# Patient Record
Sex: Male | Born: 1945 | Race: Black or African American | Hispanic: No | Marital: Married | Smoking: Current some day smoker
Health system: Southern US, Community
[De-identification: ages and names within clinical notes are randomized; demographics above are authoritative.]

## PROBLEM LIST (undated history)

## (undated) DIAGNOSIS — S82899A Other fracture of unspecified lower leg, initial encounter for closed fracture: Secondary | ICD-10-CM

## (undated) DIAGNOSIS — I10 Essential (primary) hypertension: Secondary | ICD-10-CM

## (undated) DIAGNOSIS — M109 Gout, unspecified: Secondary | ICD-10-CM

## (undated) DIAGNOSIS — E119 Type 2 diabetes mellitus without complications: Secondary | ICD-10-CM

## (undated) HISTORY — PX: CATARACT EXTRACTION: SUR2

## (undated) HISTORY — DX: Type 2 diabetes mellitus without complications: E11.9

## (undated) HISTORY — DX: Other fracture of unspecified lower leg, initial encounter for closed fracture: S82.899A

---

## 1998-11-01 ENCOUNTER — Inpatient Hospital Stay (HOSPITAL_COMMUNITY): Admission: AD | Admit: 1998-11-01 | Discharge: 1998-11-05 | Payer: Self-pay | Admitting: Cardiology

## 1998-11-04 ENCOUNTER — Encounter: Payer: Self-pay | Admitting: Cardiology

## 1998-11-04 ENCOUNTER — Encounter: Payer: Self-pay | Admitting: *Deleted

## 1998-11-05 ENCOUNTER — Encounter: Payer: Self-pay | Admitting: Gastroenterology

## 1998-11-14 ENCOUNTER — Ambulatory Visit (HOSPITAL_COMMUNITY): Admission: RE | Admit: 1998-11-14 | Discharge: 1998-11-14 | Payer: Self-pay | Admitting: Gastroenterology

## 1998-11-14 ENCOUNTER — Encounter: Payer: Self-pay | Admitting: Gastroenterology

## 2005-08-17 ENCOUNTER — Emergency Department (HOSPITAL_COMMUNITY): Admission: EM | Admit: 2005-08-17 | Discharge: 2005-08-17 | Payer: Self-pay | Admitting: Emergency Medicine

## 2007-09-13 ENCOUNTER — Encounter: Payer: Self-pay | Admitting: Internal Medicine

## 2007-09-13 ENCOUNTER — Ambulatory Visit: Payer: Self-pay | Admitting: Internal Medicine

## 2007-09-13 ENCOUNTER — Ambulatory Visit (HOSPITAL_COMMUNITY): Admission: RE | Admit: 2007-09-13 | Discharge: 2007-09-13 | Payer: Self-pay | Admitting: Internal Medicine

## 2007-09-13 HISTORY — PX: COLONOSCOPY: SHX174

## 2007-12-23 DIAGNOSIS — Z87891 Personal history of nicotine dependence: Secondary | ICD-10-CM

## 2007-12-23 DIAGNOSIS — Z8679 Personal history of other diseases of the circulatory system: Secondary | ICD-10-CM | POA: Insufficient documentation

## 2010-09-16 ENCOUNTER — Encounter: Payer: Self-pay | Admitting: Internal Medicine

## 2010-10-14 NOTE — Op Note (Signed)
[name], [name]                ACCOUNT NO.:  [id number]   MEDICAL RECORD NO.:  [id number]          PATIENT TYPE:  AMB   LOCATION:  DAY                           FACILITY:  [facility]   PHYSICIAN:  R. Roetta Sessions, M.D. DATE OF BIRTH:  1945/09/30   DATE OF PROCEDURE:  09/13/2007  DATE OF DISCHARGE:                               OPERATIVE REPORT   INDICATIONS FOR PROCEDURE:  A 61-year African American gentleman with no  lower GI tract symptoms sent at the request of Dr. Felecia Shelling for colorectal  cancer screening.  He has never had his lower GI tract imaged.  There is  no family history of colorectal ablation.  Colonoscopy is now being  done.  Potential risks, benefits, alternatives, and limitations have  been reviewed, questions answered.  He is agreeable.  Please see  documentation and medical record.   PROCEDURE NOTE:  O2 saturation, blood pressure, pulse, and respirations  were monitored throughout the entire procedure.  Conscious sedation of  Versed 3 mg IV, Demerol 75 mg IV in divided doses.   INSTRUMENT:  Pentax video chip system.   FINDINGS:  Digital rectal exam revealed no abnormalities.  The scope was  placed.  The prep was adequate.  Colon:  Colonic mucosa was surveyed  from the rectosigmoid junction through the left transverse right colon  to the appendiceal orifice, ileocecal valve, and cecum.  These  structures were well seen and photographed for the record.  Terminal  ileum was intubated 5 cm from this level.  Scope was slowly and  cautiously withdrawn.  All previous mentioned mucosal surfaces were  again seen.  At the splenic flexure, there was a 1-cm pedunculated  polyp.  The remainder of the colonic mucosa appeared entirely normal.  This polyp was removed with hot snare cautery and recovered through the  scope.  The scope was then pulled down into the rectum where thorough  examination of rectal mucosa including retroflexion and anteversion  demonstrated no  abnormalities.  The patient tolerated the procedure well  and left the endoscopy area.   IMPRESSION:  1. Normal rectum.  2. Splenic flexure polyp removed as described above.  The colonic      mucosa and terminal ileal mucosa appeared normal.   RECOMMENDATIONS:  1. Follow up on path.  2. Further recommendations to follow.      Jonathon Bellows, M.D.  Electronically Signed     RMR/MEDQ  D:  09/13/2007  T:  09/13/2007  Job:  [id number]   cc:   Ninetta Lights D. Felecia Shelling, MD  Fax: [phone]

## 2010-10-24 ENCOUNTER — Ambulatory Visit: Payer: Self-pay | Admitting: Internal Medicine

## 2011-05-15 ENCOUNTER — Other Ambulatory Visit: Payer: Self-pay | Admitting: *Deleted

## 2014-05-16 ENCOUNTER — Emergency Department (HOSPITAL_COMMUNITY): Payer: Commercial Managed Care - HMO

## 2014-05-16 ENCOUNTER — Encounter (HOSPITAL_COMMUNITY): Payer: Self-pay | Admitting: *Deleted

## 2014-05-16 ENCOUNTER — Emergency Department (HOSPITAL_COMMUNITY)
Admission: EM | Admit: 2014-05-16 | Discharge: 2014-05-16 | Disposition: A | Discharge status: Home / Self Care | Payer: Commercial Managed Care - HMO | Attending: Emergency Medicine | Admitting: Emergency Medicine

## 2014-05-16 DIAGNOSIS — I10 Essential (primary) hypertension: Secondary | ICD-10-CM | POA: Diagnosis not present

## 2014-05-16 DIAGNOSIS — M25562 Pain in left knee: Secondary | ICD-10-CM | POA: Insufficient documentation

## 2014-05-16 DIAGNOSIS — Z79899 Other long term (current) drug therapy: Secondary | ICD-10-CM | POA: Insufficient documentation

## 2014-05-16 DIAGNOSIS — Z720 Tobacco use: Secondary | ICD-10-CM | POA: Insufficient documentation

## 2014-05-16 DIAGNOSIS — R52 Pain, unspecified: Secondary | ICD-10-CM

## 2014-05-16 DIAGNOSIS — M79605 Pain in left leg: Secondary | ICD-10-CM

## 2014-05-16 DIAGNOSIS — M10072 Idiopathic gout, left ankle and foot: Secondary | ICD-10-CM | POA: Diagnosis not present

## 2014-05-16 HISTORY — DX: Essential (primary) hypertension: I10

## 2014-05-16 HISTORY — DX: Gout, unspecified: M10.9

## 2014-05-16 MED ORDER — INDOMETHACIN 25 MG PO CAPS: 25.0000 mg | ORAL_CAPSULE | Freq: Three times a day (TID) | ORAL | Status: DC | PRN

## 2014-05-16 MED ORDER — TRAMADOL HCL 50 MG PO TABS: 50.0000 mg | ORAL_TABLET | Freq: Four times a day (QID) | ORAL | Status: DC | PRN

## 2014-05-16 NOTE — ED Notes (Signed)
Pt states gout pain behind left knee began Friday and was worse Monday.

## 2014-05-16 NOTE — Discharge Instructions (Signed)
Follow up with your md in 1 week °

## 2014-05-16 NOTE — ED Provider Notes (Signed)
CSN: [id number]     Arrival date & time 05/16/14  0903 History  This chart was scribe for Todd Diego, MD by Judithann Sauger, ED Scribe. The patient was seen in room [facility] and the patient's care was started at 9:21 AM.     Chief Complaint  Patient presents with  . Gout   The history is provided by the patient. No language interpreter was used.   HPI Comments: Todd [name] is a 68 y.o. male who presents to the Emergency Department complaining of a gradually worsening gout behind his left knee onset 5 days ago. He reports associated knee pain. He explains that the gout is normally in his left big toe and has never been in this location before. He adds that he has not had a flare up in two years.   Past Medical History  Diagnosis Date  . Gout   . Hypertension    Past Surgical History  Procedure Laterality Date  . Colonoscopy  09/13/2007    normal rectum,splenic flexure polyp removed 1 cm   . Cataract extraction     No family history on file. History  Substance Use Topics  . Smoking status: Current Some Day Smoker  . Smokeless tobacco: Not on file  . Alcohol Use: Yes     Comment: daily. Couple of shots.     Review of Systems  Constitutional: Negative for appetite change and fatigue.  HENT: Negative for congestion, ear discharge and sinus pressure.   Eyes: Negative for discharge.  Respiratory: Negative for cough.   Cardiovascular: Negative for chest pain.  Gastrointestinal: Negative for abdominal pain and diarrhea.  Genitourinary: Negative for frequency and hematuria.  Musculoskeletal: Positive for arthralgias (left knee). Negative for back pain.  Skin: Negative for rash.  Neurological: Negative for seizures and headaches.  Psychiatric/Behavioral: Negative for hallucinations.      Allergies  Review of patient's allergies indicates no known allergies.  Home Medications   Prior to Admission medications   Medication Sig Start Date End Date Taking? Authorizing  Provider  amLODipine (NORVASC) 10 MG tablet Take 10 mg by mouth daily.      Historical Provider, MD  lisinopril (PRINIVIL,ZESTRIL) 20 MG tablet Take 20 mg by mouth daily.      Historical Provider, MD  lovastatin (MEVACOR) 20 MG tablet Take 20 mg by mouth at bedtime.      Historical Provider, MD   BP 150/84 mmHg  Pulse 84  Temp(Src) 98.4 F (36.9 C) (Oral)  Resp 16  Ht 5\' 7"  (1.702 m)  Wt 170 lb (77.111 kg)  BMI 26.62 kg/m2  SpO2 99% Physical Exam  Constitutional: He is oriented to person, place, and time. He appears well-developed.  HENT:  Head: Normocephalic.  Eyes: Conjunctivae are normal.  Neck: No tracheal deviation present.  Cardiovascular:  No murmur heard. Musculoskeletal: Normal range of motion. He exhibits tenderness.  Mild tenderness to superior left calf and mild tenderness to lateral left knee   Neurological: He is oriented to person, place, and time.  Skin: Skin is warm.  Psychiatric: He has a normal mood and affect.    ED Course  Procedures (including critical care time) DIAGNOSTIC STUDIES: Oxygen Saturation is 99% on RA, normal by my interpretation.    COORDINATION OF CARE: 9:25 AM- Pt advised of plan for treatment and pt agrees.    Labs Review Labs Reviewed - No data to display  Imaging Review No results found.   EKG Interpretation None  MDM   Final diagnoses:  None    Gout ,  tx with indocin and ultram  I personally performed the services described in this documentation, which was scribed in my presence. The recorded information has been reviewed and is accurate.    Todd Diego, MD 05/16/14 [phone]

## 2014-06-27 DIAGNOSIS — I10 Essential (primary) hypertension: Secondary | ICD-10-CM | POA: Diagnosis not present

## 2014-06-27 DIAGNOSIS — M109 Gout, unspecified: Secondary | ICD-10-CM | POA: Diagnosis not present

## 2014-11-02 ENCOUNTER — Emergency Department (HOSPITAL_COMMUNITY)
Admission: EM | Admit: 2014-11-02 | Discharge: 2014-11-02 | Disposition: A | Discharge status: Home / Self Care | Payer: Worker's Compensation | Attending: Emergency Medicine | Admitting: Emergency Medicine

## 2014-11-02 ENCOUNTER — Emergency Department (HOSPITAL_COMMUNITY): Payer: Worker's Compensation

## 2014-11-02 ENCOUNTER — Encounter (HOSPITAL_COMMUNITY): Payer: Self-pay | Admitting: Emergency Medicine

## 2014-11-02 DIAGNOSIS — S99912A Unspecified injury of left ankle, initial encounter: Secondary | ICD-10-CM | POA: Diagnosis present

## 2014-11-02 DIAGNOSIS — Z8739 Personal history of other diseases of the musculoskeletal system and connective tissue: Secondary | ICD-10-CM | POA: Diagnosis not present

## 2014-11-02 DIAGNOSIS — Z720 Tobacco use: Secondary | ICD-10-CM | POA: Insufficient documentation

## 2014-11-02 DIAGNOSIS — Y990 Civilian activity done for income or pay: Secondary | ICD-10-CM | POA: Diagnosis not present

## 2014-11-02 DIAGNOSIS — S82832A Other fracture of upper and lower end of left fibula, initial encounter for closed fracture: Secondary | ICD-10-CM | POA: Diagnosis not present

## 2014-11-02 DIAGNOSIS — W1849XA Other slipping, tripping and stumbling without falling, initial encounter: Secondary | ICD-10-CM | POA: Diagnosis not present

## 2014-11-02 DIAGNOSIS — I10 Essential (primary) hypertension: Secondary | ICD-10-CM | POA: Insufficient documentation

## 2014-11-02 DIAGNOSIS — S8262XA Displaced fracture of lateral malleolus of left fibula, initial encounter for closed fracture: Secondary | ICD-10-CM | POA: Diagnosis not present

## 2014-11-02 DIAGNOSIS — S82402A Unspecified fracture of shaft of left fibula, initial encounter for closed fracture: Secondary | ICD-10-CM

## 2014-11-02 DIAGNOSIS — Y9289 Other specified places as the place of occurrence of the external cause: Secondary | ICD-10-CM | POA: Diagnosis not present

## 2014-11-02 DIAGNOSIS — Z79899 Other long term (current) drug therapy: Secondary | ICD-10-CM | POA: Diagnosis not present

## 2014-11-02 DIAGNOSIS — Y9301 Activity, walking, marching and hiking: Secondary | ICD-10-CM | POA: Insufficient documentation

## 2014-11-02 MED ORDER — HYDROCODONE-ACETAMINOPHEN 5-325 MG PO TABS
ORAL_TABLET | ORAL | Status: AC
  Administered 2014-11-02: 1
  Filled 2014-11-02: qty 1

## 2014-11-02 MED ORDER — HYDROCODONE-ACETAMINOPHEN 5-325 MG PO TABS: 1.0000 | ORAL_TABLET | ORAL | Status: DC | PRN

## 2014-11-02 NOTE — ED Provider Notes (Signed)
CSN: [id number]     Arrival date & time 11/02/14  1616 History   First MD Initiated Contact with Patient 11/02/14 1624     Chief Complaint  Patient presents with  . Ankle Pain     (Consider location/radiation/quality/duration/timing/severity/associated sxs/prior Treatment) The history is provided by the patient.   Todd [name] is a 69 y.o. 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  Pain is aching, constant and worse with palpation, movement and weight bearing.  The patient was able to weight bear immediately after the event.  There is no radiation of pain and the patient denies numbness distal to the injury site.  He has had no treatments prior to arrival for this injury.    Past Medical History  Diagnosis Date  . Gout   . Hypertension    Past Surgical History  Procedure Laterality Date  . Colonoscopy  09/13/2007    normal rectum,splenic flexure polyp removed 1 cm   . Cataract extraction     History reviewed. No pertinent family history. History  Substance Use Topics  . Smoking status: Current Some Day Smoker  . Smokeless tobacco: Not on file  . Alcohol Use: Yes     Comment: daily. Couple of shots.     Review of Systems  Musculoskeletal: Positive for joint swelling and arthralgias.  Skin: Negative for wound.  Neurological: Negative for weakness and numbness.      Allergies  Review of patient's allergies indicates no known allergies.  Home Medications   Prior to Admission medications   Medication Sig Start Date End Date Taking? Authorizing Provider  amLODipine (NORVASC) 10 MG tablet Take 10 mg by mouth daily.      Historical Provider, MD  indomethacin (INDOCIN) 25 MG capsule Take 1 capsule (25 mg total) by mouth 3 (three) times daily as needed. Patient not taking: Reported on 11/02/2014 05/16/14   Milton Ferguson, MD   lisinopril (PRINIVIL,ZESTRIL) 20 MG tablet Take 20 mg by mouth daily.      Historical Provider, MD  traMADol (ULTRAM) 50 MG tablet Take 1 tablet (50 mg total) by mouth every 6 (six) hours as needed. Patient not taking: Reported on 11/02/2014 05/16/14   Milton Ferguson, MD   Pulse 81  Temp(Src) 98.6 F (37 C) (Oral)  Resp 18  Ht 5\' 7"  (1.702 m)  Wt 170 lb (77.111 kg)  BMI 26.62 kg/m2  SpO2 98% Physical Exam  Constitutional: He appears well-developed and well-nourished.  HENT:  Head: Normocephalic.  Cardiovascular: Normal rate and intact distal pulses.  Exam reveals no decreased pulses.   Pulses:      Dorsalis pedis pulses are 2+ on the right side, and 2+ on the left side.       Posterior tibial pulses are 2+ on the right side, and 2+ on the left side.  Musculoskeletal: He exhibits edema and tenderness.       Right ankle: He exhibits swelling.       Left ankle: He exhibits decreased range of motion and swelling. He exhibits no ecchymosis, no deformity and normal pulse. Tenderness. Lateral malleolus tenderness found. No proximal fibula tenderness found. Achilles tendon normal.  Neurological: He is alert. No sensory deficit.  Skin: Skin is warm, dry and intact.  Nursing note and vitals reviewed.   ED Course  Procedures (including critical care time) Labs Review Labs Reviewed -  No data to display  Imaging Review Dg Ankle Complete Left  11/02/2014   CLINICAL DATA:  Stepped on something and twisted his ankle. Lateral ankle pain.  EXAM: LEFT ANKLE COMPLETE - 3+ VIEW  COMPARISON:  None.  FINDINGS: An oblique fracture is present in the distal fibula with overlying soft tissue swelling. Fracture is displaced at least 3 mm. The ankle joint is intact. No additional fractures are evident. Vascular calcifications are noted in the posterior tibial artery. Calcaneal spurs are present.  IMPRESSION: 1. Mildly displaced oblique fracture of the distal fibular with adjacent soft tissue swelling. 2.  Atherosclerotic calcifications within the posterior tibial artery. 3. Calcaneal spurs.   Electronically Signed   By: San Morelle M.D.   On: 11/02/2014 16:54     EKG Interpretation None      MDM   Final diagnoses:  Left fibular fracture, closed, initial encounter    Patients labs and/or radiological studies were reviewed and considered during the medical decision making and disposition process.  Results were also discussed with patient.  Patient was placed in a posterior splint with stirrups.  Crutches provided.  Recommended rest, ice, elevation and follow-up with orthopedics.  He was given referrals to Dr. Aline Brochure and Dr. Luna Glasgow as we have no formal orthopedic coverage today.  His PCP is the [facility] in [place], and he expresses that he continued with an orthopedic there very quickly as well.  He was encouraged follow-up within the next 3-4 days but the concentrate on reducing swelling with ice and elevation over the weekend.  His prescribed hydrocodone when necessary pain.  The patient appears reasonably screened and/or stabilized for discharge and I doubt any other medical condition or other [facility] requiring further screening, evaluation, or treatment in the ED at this time prior to discharge.   Evalee Jefferson, PA-C 11/02/14 1734  Elnora Morrison, MD 11/02/14 [phone]

## 2014-11-02 NOTE — ED Notes (Signed)
Pt reports was at work and tripped. Pt reports left leg bent underneath him and reports left ankle pain ever since. nad noted.

## 2014-11-02 NOTE — ED Notes (Signed)
Ice elevate 

## 2014-11-02 NOTE — Discharge Instructions (Signed)
Fibular Fracture, Ankle, Adult, Treated With or Without Immobilization A fibular fracture at your ankle is a break (fracture) bone in the smallest of the two bones in your lower leg, located on the outside of your leg (fibula) close to the area at your ankle joint. CAUSES  Rolling your ankle.  Twisting your ankle.  Extreme flexing or extending of your foot.  Severe force on your ankle as when falling from a distance. RISK FACTORS  Jumping activities.  Participation in sports.  Osteoporosis.  Advanced age.  Previous ankle injuries. SIGNS AND SYMPTOMS  Pain.  Swelling.  Inability to put weight on injured ankle.  Bruising.  Bone deformities at site of injury. DIAGNOSIS  This fracture is diagnosed with the help of an X-ray exam. TREATMENT  If the fractured bone did not move out of place it usually will heal without problems and does nt require casting or splinting. If immobilization is needed for comfort or the fractured bone moved out of place and will not heal properly with immobilization, a cast or splint will be used. HOME CARE INSTRUCTIONS   Apply ice to the area of injury:  Put ice in a plastic bag.  Place a towel between your skin and the bag.  Leave the ice on for 20 minutes, 2-3 times a day.  Use crutches as directed. Resume walking without crutches as directed by your health care provider.  Only take over-the-counter or prescription medicines for pain, discomfort, or fever as directed by your health care provider.  If you have a removable splint or boot, do not remove the boot unless directed by your health care provider. SEEK MEDICAL CARE IF:   You have continued pain or more swelling  The medications do not control the pain. SEEK IMMEDIATE MEDICAL CARE IF:  You develop severe pain in the leg or foot.  Your skin or nails below the injury turn blue or grey or feel cold or numb. MAKE SURE YOU:   Understand these instructions.  Will watch your  condition.  Will get help right away if you are not doing well or get worse. Document Released: 05/18/2005 Document Revised: 03/08/2013 Document Reviewed: 12/28/2012 [facility] Patient Information 2015 [place], [place]. This information is not intended to replace advice given to you by your health care provider. Make sure you discuss any questions you have with your health care provider.   You may take the hydrocodone prescribed for pain relief.  This will make you drowsy - do not drive within 4 hours of taking this medication.

## 2014-12-14 ENCOUNTER — Telehealth: Payer: Self-pay | Admitting: Internal Medicine

## 2014-12-14 NOTE — Telephone Encounter (Signed)
Pt came in last week as his wife was being seen as a patient and he asked when his next colonoscopy was due. I retrieved the last colonoscopy report from [facility] and I may be overlooking something, but can you tell me when he should have another colonoscopy? He wasn't put on the recall list and I told patient that I would call him to let him know. Please advise. I put the procedure report from 2009 in your office.

## 2014-12-17 NOTE — Telephone Encounter (Signed)
Looks like pt had an office visit scheduled on 10/24/2010 to schedule a tcs and the pt cancelled that office visit. Pt needs an office visit and we also need the last path report from the pts chart in storage. That will have RMR recommendations on it.

## 2014-12-21 NOTE — Telephone Encounter (Signed)
I have faxed request to storage ([facility] and medical records at [facility]) to send [place] the path report.

## 2015-01-01 ENCOUNTER — Telehealth: Payer: Self-pay | Admitting: Internal Medicine

## 2015-01-01 ENCOUNTER — Encounter: Payer: Self-pay | Admitting: Internal Medicine

## 2015-01-01 NOTE — Telephone Encounter (Signed)
Pt is aware of OV on 8/31 at 9 with [facility] and appt letter and card was mailed

## 2015-01-01 NOTE — Telephone Encounter (Signed)
Im following up on patient's request of when his next colonoscopy is due. I have the 2009 procedure and path report. I requested the path report from storage ([place]) on Friday 12/21/2014 and since I was out last week I don't know if the path report was sent or not. Please advise if I need to request it again or can I go ahead and make the patient an office visit.

## 2015-01-01 NOTE — Telephone Encounter (Signed)
Just go ahead and make him an appt. The 2009 path report is in epic, it just doesn't have RMR recommendations on it.

## 2015-01-30 ENCOUNTER — Encounter: Payer: Self-pay | Admitting: Nurse Practitioner

## 2015-01-30 ENCOUNTER — Telehealth: Payer: Self-pay | Admitting: Nurse Practitioner

## 2015-01-30 ENCOUNTER — Ambulatory Visit: Payer: Commercial Managed Care - HMO | Admitting: Nurse Practitioner

## 2015-01-30 NOTE — Telephone Encounter (Signed)
Noted  

## 2015-01-30 NOTE — Telephone Encounter (Signed)
PATIENT WAS A NO SHOW AND LETTER SENT  °

## 2015-02-25 DIAGNOSIS — F17200 Nicotine dependence, unspecified, uncomplicated: Secondary | ICD-10-CM | POA: Diagnosis not present

## 2015-02-25 DIAGNOSIS — M109 Gout, unspecified: Secondary | ICD-10-CM | POA: Diagnosis not present

## 2015-02-25 DIAGNOSIS — I10 Essential (primary) hypertension: Secondary | ICD-10-CM | POA: Diagnosis not present

## 2015-02-25 DIAGNOSIS — Z720 Tobacco use: Secondary | ICD-10-CM | POA: Diagnosis not present

## 2015-02-27 ENCOUNTER — Other Ambulatory Visit: Payer: Self-pay

## 2015-02-27 ENCOUNTER — Encounter: Payer: Self-pay | Admitting: Nurse Practitioner

## 2015-02-27 ENCOUNTER — Ambulatory Visit (INDEPENDENT_AMBULATORY_CARE_PROVIDER_SITE_OTHER): Payer: Commercial Managed Care - HMO | Admitting: Nurse Practitioner

## 2015-02-27 VITALS — BP 152/87 | HR 82 | Temp 98.3°F | Ht 67.0 in | Wt 170.0 lb

## 2015-02-27 DIAGNOSIS — Z86010 Personal history of colonic polyps: Secondary | ICD-10-CM

## 2015-02-27 MED ORDER — PEG 3350-KCL-NA BICARB-NACL 420 G PO SOLR
4000.0000 mL | ORAL | Status: DC
Start: 2015-02-27 — End: 2020-05-17

## 2015-02-27 NOTE — Assessment & Plan Note (Signed)
69 year old male presents for surveillance colonoscopy. Last colonoscopy 2009 with 1 cm splenic flexure pedunculated polyp which found to be tubular adenoma. Presents for on time surveillance. Generally asymptomatic from a GI standpoint. We'll proceed with surveillance colonoscopy is planned.  Proceed with TCS with Phenergan 25mg  preprocedure with Dr. Gala Romney in near future: the risks, benefits, and alternatives have been discussed with the patient in detail. The patient states understanding and desires to proceed.  Patient is on any anticoagulants, chronic pain medications, anxiolytics, or antidepressants. However he does have 2 shots of alcohol daily. His last colonoscopy required 75 mg Demerol and 3 mg of Versed and was drinking at that time as well. We'll plan for 25 mg IV Phenergan preprocedure to promote adequate sedation.

## 2015-02-27 NOTE — Patient Instructions (Signed)
1. We will schedule your procedure for you. 2. Further recommendations to be based on results of your procedure. 

## 2015-02-27 NOTE — Progress Notes (Signed)
Primary Care Physician:  Rosita Fire, MD Primary Gastroenterologist:  Dr. Gala Romney  Chief Complaint  Patient presents with  . Colonoscopy    HPI:   68 year old male presents to schedule surveillance colonoscopy. His last colonoscopy was completed on 09/13/2007 which found normal rectum, single 1 cm pedunculated polyp in the splenic flexure. Pathology revealed tubular adenoma. Recommended repeat in 2016. He is currently scheduled for a surveillance procedure.  Today he states he is not having any symptoms. Denies abdominal pain, N/V, hematochezia, melena, fever, chills, unintnetional weight loss. Denies chest pain, dyspnea, dizziness, lightheadedness, syncope, near syncope. Denies any other upper or lower GI symptoms.  Past Medical History  Diagnosis Date  . Gout   . Hypertension   . Ankle fracture     Past Surgical History  Procedure Laterality Date  . Colonoscopy  09/13/2007    normal rectum,splenic flexure polyp removed 1 cm   . Cataract extraction      Current Outpatient Prescriptions  Medication Sig Dispense Refill  . amLODipine (NORVASC) 10 MG tablet Take 10 mg by mouth daily.      . indomethacin (INDOCIN) 25 MG capsule Take 1 capsule (25 mg total) by mouth 3 (three) times daily as needed. 30 capsule 0  . lisinopril (PRINIVIL,ZESTRIL) 20 MG tablet Take 20 mg by mouth daily.       No current facility-administered medications for this visit.    Allergies as of 02/27/2015  . (No Known Allergies)    Family History  Problem Relation Age of Onset  . Colon cancer Neg Hx     Social History   Social History  . Marital Status: Married    Spouse Name: N/A  . Number of Children: N/A  . Years of Education: N/A   Occupational History  . Not on file.   Social History Main Topics  . Smoking status: Current Some Day Smoker  . Smokeless tobacco: Never Used     Comment: Smokes "occasionally, not every day."  . Alcohol Use: 0.0 oz/week    0 Standard drinks or  equivalent per week     Comment: daily. Couple of shots.   . Drug Use: No  . Sexual Activity: Not on file   Other Topics Concern  . Not on file   Social History Narrative    Review of Systems: General: Negative for anorexia, weight loss, fever, chills, fatigue, weakness. Eyes: Negative for vision changes.  ENT: Negative for hoarseness, difficulty swallowing. CV: Negative for chest pain, angina, palpitations, peripheral edema.  Respiratory: Negative for dyspnea at rest, cough, sputum, wheezing.  GI: See history of present illness. Derm: Negative for rash or itching.  Endo: Negative for unusual weight change.  Heme: Negative for bruising or bleeding. Allergy: Negative for rash or hives.    Physical Exam: BP 152/87 mmHg  Pulse 82  Temp(Src) 98.3 F (36.8 C) (Oral)  Ht 5\' 7"  (1.702 m)  Wt 170 lb (77.111 kg)  BMI 26.62 kg/m2 General:   Alert and oriented. Pleasant and cooperative. Well-nourished and well-developed.  Head:  Normocephalic and atraumatic. Eyes:  Without icterus, sclera clear and conjunctiva pink.  Throat/Neck:  Supple, without mass or thyromegaly. Cardiovascular:  S1, S2 present without murmurs appreciated. Normal pulses noted. Extremities without clubbing or edema. Respiratory:  Clear to auscultation bilaterally. No wheezes, rales, or rhonchi. No distress.  Gastrointestinal:  +BS, soft, non-tender and non-distended. No HSM noted. No guarding or rebound. No masses appreciated.  Rectal:  Deferred  Neurologic:  Alert and  oriented x4;  grossly normal neurologically. Psych:  Alert and cooperative. Normal mood and affect.    02/27/2015 9:06 AM

## 2015-02-27 NOTE — Progress Notes (Signed)
CC'D TO PCP °

## 2015-03-08 ENCOUNTER — Ambulatory Visit (HOSPITAL_COMMUNITY)
Admission: RE | Admit: 2015-03-08 | Discharge: 2015-03-08 | Disposition: A | Discharge status: Home / Self Care | Payer: Commercial Managed Care - HMO | Source: Ambulatory Visit | Attending: Internal Medicine | Admitting: Internal Medicine

## 2015-03-08 ENCOUNTER — Encounter (HOSPITAL_COMMUNITY)
Admission: RE | Disposition: A | Discharge status: Home / Self Care | Payer: Self-pay | Source: Ambulatory Visit | Attending: Internal Medicine

## 2015-03-08 ENCOUNTER — Encounter (HOSPITAL_COMMUNITY): Payer: Self-pay | Admitting: *Deleted

## 2015-03-08 DIAGNOSIS — K5730 Diverticulosis of large intestine without perforation or abscess without bleeding: Secondary | ICD-10-CM | POA: Diagnosis not present

## 2015-03-08 DIAGNOSIS — Z86010 Personal history of colonic polyps: Secondary | ICD-10-CM | POA: Diagnosis not present

## 2015-03-08 DIAGNOSIS — Z1211 Encounter for screening for malignant neoplasm of colon: Secondary | ICD-10-CM | POA: Insufficient documentation

## 2015-03-08 DIAGNOSIS — I10 Essential (primary) hypertension: Secondary | ICD-10-CM | POA: Insufficient documentation

## 2015-03-08 DIAGNOSIS — D123 Benign neoplasm of transverse colon: Secondary | ICD-10-CM | POA: Insufficient documentation

## 2015-03-08 DIAGNOSIS — D124 Benign neoplasm of descending colon: Secondary | ICD-10-CM

## 2015-03-08 HISTORY — PX: COLONOSCOPY: SHX5424

## 2015-03-08 SURGERY — COLONOSCOPY
Anesthesia: Moderate Sedation

## 2015-03-08 MED ORDER — SODIUM CHLORIDE 0.9 % IV SOLN
INTRAVENOUS | Status: DC
  Administered 2015-03-08: 10:00:00 via INTRAVENOUS

## 2015-03-08 MED ORDER — MIDAZOLAM HCL 5 MG/5ML IJ SOLN
INTRAMUSCULAR | Status: DC | PRN
  Administered 2015-03-08: 2 mg via INTRAVENOUS

## 2015-03-08 MED ORDER — PROMETHAZINE HCL 25 MG/ML IJ SOLN
INTRAMUSCULAR | Status: AC
  Filled 2015-03-08: qty 1

## 2015-03-08 MED ORDER — ONDANSETRON HCL 4 MG/2ML IJ SOLN
INTRAMUSCULAR | Status: DC | PRN
Start: 2015-03-08 — End: 2015-03-08
  Administered 2015-03-08: 4 mg via INTRAVENOUS

## 2015-03-08 MED ORDER — PROMETHAZINE HCL 25 MG/ML IJ SOLN
12.5000 mg | Freq: Once | INTRAMUSCULAR | Status: AC
  Administered 2015-03-08: 12.5 mg via INTRAVENOUS

## 2015-03-08 MED ORDER — ONDANSETRON HCL 4 MG/2ML IJ SOLN
INTRAMUSCULAR | Status: AC
  Filled 2015-03-08: qty 2

## 2015-03-08 MED ORDER — MEPERIDINE HCL 100 MG/ML IJ SOLN
INTRAMUSCULAR | Status: DC | PRN
  Administered 2015-03-08: 50 mg via INTRAVENOUS

## 2015-03-08 MED ORDER — SODIUM CHLORIDE 0.9 % IJ SOLN
INTRAMUSCULAR | Status: AC
  Filled 2015-03-08: qty 3

## 2015-03-08 MED ORDER — MIDAZOLAM HCL 5 MG/5ML IJ SOLN
INTRAMUSCULAR | Status: AC
  Filled 2015-03-08: qty 10

## 2015-03-08 MED ORDER — MEPERIDINE HCL 100 MG/ML IJ SOLN
INTRAMUSCULAR | Status: AC
  Filled 2015-03-08: qty 2

## 2015-03-08 MED ORDER — STERILE WATER FOR IRRIGATION IR SOLN
Status: DC | PRN
  Administered 2015-03-08: 10:00:00

## 2015-03-08 NOTE — Op Note (Signed)
[facility] [address] [place], [address]   COLONOSCOPY PROCEDURE REPORT  PATIENT: Pharoah, Goggins  MR#: [id number] BIRTHDATE: [birth year] , 7  yrs. old GENDER: male ENDOSCOPIST: R.  Garfield Cornea, MD FACP [facility] REFERRED [id number] Legrand Rams, M.D. PROCEDURE DATE:  04-04-15 PROCEDURE:   Colonoscopy with snare polypectomy INDICATIONS:Surveillance examination; history of colonic adenoma. MEDICATIONS: Versed 2 mg IV and Demerol 50 mg IV in divided doses. Phenergan 12.5 mg IV.  Zofran 4 mg IV. ASA CLASS:       Class II  CONSENT: The risks, benefits, alternatives and imponderables including but not limited to bleeding, perforation as well as the possibility of a missed lesion have been reviewed.  The potential for biopsy, lesion removal, etc. have also been discussed. Questions have been answered.  All parties agreeable.  Please see the history and physical in the medical record for more information.  DESCRIPTION OF PROCEDURE:   After the risks benefits and alternatives of the procedure were thoroughly explained, informed consent was obtained.  The digital rectal exam revealed no abnormalities of the rectum.   The EC-[id number] ([id number])  endoscope was introduced through the anus and advanced to the cecum, which was identified by both the appendix and ileocecal valve. No adverse events experienced.   The quality of the prep was adequate  The instrument was then slowly withdrawn as the colon was fully examined. Estimated blood loss is zero unless otherwise noted in this procedure report.      COLON FINDINGS: Normal-appearing rectal mucosa.  Scattered left-sided diverticula; (1) 4 mm pedunculated polyp in the mid descending segment; otherwise, the remainder of the colonic mucosa appeared normal.  Retroflexion was performed. .  Withdrawal time=9 minutes 0 seconds.  The scope was withdrawn and the procedure completed. COMPLICATIONS: There were no immediate  complications. EBL 4 mL ENDOSCOPIC IMPRESSION: Colonic diverticulosis. Single colonic polyp?"removed as described above  RECOMMENDATIONS: Follow-up on pathology.  eSigned:  R. Garfield Cornea, MD Rosalita Chessman [facility] April 04, 2015 10:38 AM   cc:  CPT CODES: ICD CODES:  The ICD and CPT codes recommended by this software are interpretations from the data that the clinical staff has captured with the software.  The verification of the translation of this report to the ICD and CPT codes and modifiers is the sole responsibility of the health care institution and practicing physician where this report was generated.  [employer] will not be held responsible for the validity of the ICD and CPT codes included on this report.  AMA assumes no liability for data contained or not contained herein. CPT is a Designer, television/film set of the [employer].

## 2015-03-08 NOTE — Interval H&P Note (Signed)
History and Physical Interval Note:  03/08/2015 10:14 AM  Todd [name]  has presented today for surgery, with the diagnosis of history of polyps  The various methods of treatment have been discussed with the patient and family. After consideration of risks, benefits and other options for treatment, the patient has consented to  Procedure(s) with comments: COLONOSCOPY (N/A) - 115 as a surgical intervention .  The patient's history has been reviewed, patient examined, no change in status, stable for surgery.  I have reviewed the patient's chart and labs.  Questions were answered to the patient's satisfaction.     Todd [name]  No change. Surveillance colonoscopy per plan.  The risks, benefits, limitations, alternatives and imponderables have been reviewed with the patient. Questions have been answered. All parties are agreeable.

## 2015-03-08 NOTE — Discharge Instructions (Signed)
°Colonoscopy °Discharge Instructions ° °Read the instructions outlined below and refer to this sheet in the next few weeks. These discharge instructions provide you with general information on caring for yourself after you leave the hospital. Your doctor may also give you specific instructions. While your treatment has been planned according to the most current medical practices available, unavoidable complications occasionally occur. If you have any problems or questions after discharge, call Dr. Rourk at [phone]. °ACTIVITY °· You may resume your regular activity, but move at a slower pace for the next 24 hours.  °· Take frequent rest periods for the next 24 hours.  °· Walking will help get rid of the air and reduce the bloated feeling in your belly (abdomen).  °· No driving for 24 hours (because of the medicine (anesthesia) used during the test).   °· Do not sign any important legal documents or operate any machinery for 24 hours (because of the anesthesia used during the test).  °NUTRITION °· Drink plenty of fluids.  °· You may resume your normal diet as instructed by your doctor.  °· Begin with a light meal and progress to your normal diet. Heavy or fried foods are harder to digest and may make you feel sick to your stomach (nauseated).  °· Avoid alcoholic beverages for 24 hours or as instructed.  °MEDICATIONS °· You may resume your normal medications unless your doctor tells you otherwise.  °WHAT YOU CAN EXPECT TODAY °· Some feelings of bloating in the abdomen.  °· Passage of more gas than usual.  °· Spotting of blood in your stool or on the toilet paper.  °IF YOU HAD POLYPS REMOVED DURING THE COLONOSCOPY: °· No aspirin products for 7 days or as instructed.  °· No alcohol for 7 days or as instructed.  °· Eat a soft diet for the next 24 hours.  °FINDING OUT THE RESULTS OF YOUR TEST °Not all test results are available during your visit. If your test results are not back during the visit, make an appointment  with your caregiver to find out the results. Do not assume everything is normal if you have not heard from your caregiver or the medical facility. It is important for you to follow up on all of your test results.  °SEEK IMMEDIATE MEDICAL ATTENTION IF: °· You have more than a spotting of blood in your stool.  °· Your belly is swollen (abdominal distention).  °· You are nauseated or vomiting.  °· You have a temperature over 101.  °· You have abdominal pain or discomfort that is severe or gets worse throughout the day.  ° ° °Diverticulosis and colon polyp information provided ° °Further recommendations to follow pending review of pathology report ° ° °Diverticulosis °Diverticulosis is the condition that develops when small pouches (diverticula) form in the wall of your colon. Your colon, or large intestine, is where water is absorbed and stool is formed. The pouches form when the inside layer of your colon pushes through weak spots in the outer layers of your colon. °CAUSES  °No one knows exactly what causes diverticulosis. °RISK FACTORS °· Being older than 50. Your risk for this condition increases with age. Diverticulosis is rare in people younger than 40 years. By age 80, almost everyone has it. °· Eating a low-fiber diet. °· Being frequently constipated. °· Being overweight. °· Not getting enough exercise. °· Smoking. °· Taking over-the-counter pain medicines, like aspirin and ibuprofen. °SYMPTOMS  °Most people with diverticulosis do not have symptoms. °DIAGNOSIS  °  Because diverticulosis often has no symptoms, health care providers often discover the condition during an exam for other colon problems. In many cases, a health care provider will diagnose diverticulosis while using a flexible scope to examine the colon (colonoscopy). °TREATMENT  °If you have never developed an infection related to diverticulosis, you may not need treatment. If you have had an infection before, treatment may include: °· Eating more  fruits, vegetables, and grains. °· Taking a fiber supplement. °· Taking a live bacteria supplement (probiotic). °· Taking medicine to relax your colon. °HOME CARE INSTRUCTIONS  °· Drink at least 6-8 glasses of water each day to prevent constipation. °· Try not to strain when you have a bowel movement. °· Keep all follow-up appointments. °If you have had an infection before:  °· Increase the fiber in your diet as directed by your health care provider or dietitian. °· Take a dietary fiber supplement if your health care provider approves. °· Only take medicines as directed by your health care provider. °SEEK MEDICAL CARE IF:  °· You have abdominal pain. °· You have bloating. °· You have cramps. °· You have not gone to the bathroom in 3 days. °SEEK IMMEDIATE MEDICAL CARE IF:  °· Your pain gets worse. °· Your bloating becomes very bad. °· You have a fever or chills, and your symptoms suddenly get worse. °· You begin vomiting. °· You have bowel movements that are bloody or black. °MAKE SURE YOU: °· Understand these instructions. °· Will watch your condition. °· Will get help right away if you are not doing well or get worse. °  °This information is not intended to replace advice given to you by your health care provider. Make sure you discuss any questions you have with your health care provider. °  °Document Released: 02/13/2004 Document Revised: 05/23/2013 Document Reviewed: 04/12/2013 °Elsevier Interactive Patient Education ©2016 [employer] °Colon Polyps °Polyps are lumps of extra tissue growing inside the body. Polyps can grow in the large intestine (colon). Most colon polyps are noncancerous (benign). However, some colon polyps can become cancerous over time. Polyps that are larger than a pea may be harmful. To be safe, caregivers remove and test all polyps. °CAUSES  °Polyps form when mutations in the genes cause your cells to grow and divide even though no more tissue is needed. °RISK FACTORS °There are a number  of risk factors that can increase your chances of getting colon polyps. They include: °· Being older than 50 years. °· Family history of colon polyps or colon cancer. °· Long-term colon diseases, such as colitis or Crohn disease. °· Being overweight. °· Smoking. °· Being inactive. °· Drinking too much alcohol. °SYMPTOMS  °Most small polyps do not cause symptoms. If symptoms are present, they may include: °· Blood in the stool. The stool may look dark red or black. °· Constipation or diarrhea that lasts longer than 1 week. °DIAGNOSIS °People often do not know they have polyps until their caregiver finds them during a regular checkup. Your caregiver can use 4 tests to check for polyps: °· Digital rectal exam. The caregiver wears gloves and feels inside the rectum. This test would find polyps only in the rectum. °· Barium enema. The caregiver puts a liquid called barium into your rectum before taking X-rays of your colon. Barium makes your colon look white. Polyps are dark, so they are easy to see in the X-ray pictures. °· Sigmoidoscopy. A thin, flexible tube (sigmoidoscope) is placed into your rectum. The sigmoidoscope has a   light and tiny camera in it. The caregiver uses the sigmoidoscope to look at the last third of your colon. °· Colonoscopy. This test is like sigmoidoscopy, but the caregiver looks at the entire colon. This is the most common method for finding and removing polyps. °TREATMENT  °Any polyps will be removed during a sigmoidoscopy or colonoscopy. The polyps are then tested for cancer. °PREVENTION  °To help lower your risk of getting more colon polyps: °· Eat plenty of fruits and vegetables. Avoid eating fatty foods. °· Do not smoke. °· Avoid drinking alcohol. °· Exercise every day. °· Lose weight if recommended by your caregiver. °· Eat plenty of calcium and folate. Foods that are rich in calcium include milk, cheese, and broccoli. Foods that are rich in folate include chickpeas, kidney beans, and  spinach. °HOME CARE INSTRUCTIONS °Keep all follow-up appointments as directed by your caregiver. You may need periodic exams to check for polyps. °SEEK MEDICAL CARE IF: °You notice bleeding during a bowel movement. °  °This information is not intended to replace advice given to you by your health care provider. Make sure you discuss any questions you have with your health care provider. °  °Document Released: 02/12/2004 Document Revised: 06/08/2014 Document Reviewed: 07/28/2011 °Elsevier Interactive Patient Education ©2016 Elsevier Inc. ° °

## 2015-03-08 NOTE — H&P (View-Only) (Signed)
Primary Care Physician:  Rosita Fire, MD Primary Gastroenterologist:  Dr. Gala Romney  Chief Complaint  Patient presents with  . Colonoscopy    HPI:   69 year old male presents to schedule surveillance colonoscopy. His last colonoscopy was completed on 09/13/2007 which found normal rectum, single 1 cm pedunculated polyp in the splenic flexure. Pathology revealed tubular adenoma. Recommended repeat in 2016. He is currently scheduled for a surveillance procedure.  Today he states he is not having any symptoms. Denies abdominal pain, N/V, hematochezia, melena, fever, chills, unintnetional weight loss. Denies chest pain, dyspnea, dizziness, lightheadedness, syncope, near syncope. Denies any other upper or lower GI symptoms.  Past Medical History  Diagnosis Date  . Gout   . Hypertension   . Ankle fracture     Past Surgical History  Procedure Laterality Date  . Colonoscopy  09/13/2007    normal rectum,splenic flexure polyp removed 1 cm   . Cataract extraction      Current Outpatient Prescriptions  Medication Sig Dispense Refill  . amLODipine (NORVASC) 10 MG tablet Take 10 mg by mouth daily.      . indomethacin (INDOCIN) 25 MG capsule Take 1 capsule (25 mg total) by mouth 3 (three) times daily as needed. 30 capsule 0  . lisinopril (PRINIVIL,ZESTRIL) 20 MG tablet Take 20 mg by mouth daily.       No current facility-administered medications for this visit.    Allergies as of 02/27/2015  . (No Known Allergies)    Family History  Problem Relation Age of Onset  . Colon cancer Neg Hx     Social History   Social History  . Marital Status: Married    Spouse Name: N/A  . Number of Children: N/A  . Years of Education: N/A   Occupational History  . Not on file.   Social History Main Topics  . Smoking status: Current Some Day Smoker  . Smokeless tobacco: Never Used     Comment: Smokes "occasionally, not every day."  . Alcohol Use: 0.0 oz/week    0 Standard drinks or  equivalent per week     Comment: daily. Couple of shots.   . Drug Use: No  . Sexual Activity: Not on file   Other Topics Concern  . Not on file   Social History Narrative    Review of Systems: General: Negative for anorexia, weight loss, fever, chills, fatigue, weakness. Eyes: Negative for vision changes.  ENT: Negative for hoarseness, difficulty swallowing. CV: Negative for chest pain, angina, palpitations, peripheral edema.  Respiratory: Negative for dyspnea at rest, cough, sputum, wheezing.  GI: See history of present illness. Derm: Negative for rash or itching.  Endo: Negative for unusual weight change.  Heme: Negative for bruising or bleeding. Allergy: Negative for rash or hives.    Physical Exam: BP 152/87 mmHg  Pulse 82  Temp(Src) 98.3 F (36.8 C) (Oral)  Ht 5\' 7"  (1.702 m)  Wt 170 lb (77.111 kg)  BMI 26.62 kg/m2 General:   Alert and oriented. Pleasant and cooperative. Well-nourished and well-developed.  Head:  Normocephalic and atraumatic. Eyes:  Without icterus, sclera clear and conjunctiva pink.  Throat/Neck:  Supple, without mass or thyromegaly. Cardiovascular:  S1, S2 present without murmurs appreciated. Normal pulses noted. Extremities without clubbing or edema. Respiratory:  Clear to auscultation bilaterally. No wheezes, rales, or rhonchi. No distress.  Gastrointestinal:  +BS, soft, non-tender and non-distended. No HSM noted. No guarding or rebound. No masses appreciated.  Rectal:  Deferred  Neurologic:  Alert and  oriented x4;  grossly normal neurologically. Psych:  Alert and cooperative. Normal mood and affect.    02/27/2015 9:06 AM

## 2015-03-11 ENCOUNTER — Encounter: Payer: Self-pay | Admitting: Internal Medicine

## 2015-03-14 ENCOUNTER — Encounter (HOSPITAL_COMMUNITY): Payer: Self-pay | Admitting: Internal Medicine

## 2016-02-24 DIAGNOSIS — R739 Hyperglycemia, unspecified: Secondary | ICD-10-CM | POA: Diagnosis not present

## 2016-02-24 DIAGNOSIS — Z0000 Encounter for general adult medical examination without abnormal findings: Secondary | ICD-10-CM | POA: Diagnosis not present

## 2016-02-24 DIAGNOSIS — M109 Gout, unspecified: Secondary | ICD-10-CM | POA: Diagnosis not present

## 2016-02-24 DIAGNOSIS — N529 Male erectile dysfunction, unspecified: Secondary | ICD-10-CM | POA: Diagnosis not present

## 2016-02-24 DIAGNOSIS — E785 Hyperlipidemia, unspecified: Secondary | ICD-10-CM | POA: Diagnosis not present

## 2016-02-24 DIAGNOSIS — I10 Essential (primary) hypertension: Secondary | ICD-10-CM | POA: Diagnosis not present

## 2016-03-03 DIAGNOSIS — I10 Essential (primary) hypertension: Secondary | ICD-10-CM | POA: Diagnosis not present

## 2016-03-03 DIAGNOSIS — E119 Type 2 diabetes mellitus without complications: Secondary | ICD-10-CM | POA: Diagnosis not present

## 2016-03-03 DIAGNOSIS — R739 Hyperglycemia, unspecified: Secondary | ICD-10-CM | POA: Diagnosis not present

## 2016-03-03 DIAGNOSIS — E1165 Type 2 diabetes mellitus with hyperglycemia: Secondary | ICD-10-CM | POA: Diagnosis not present

## 2016-03-03 DIAGNOSIS — E784 Other hyperlipidemia: Secondary | ICD-10-CM | POA: Diagnosis not present

## 2016-03-03 DIAGNOSIS — Z0000 Encounter for general adult medical examination without abnormal findings: Secondary | ICD-10-CM | POA: Diagnosis not present

## 2016-03-27 DIAGNOSIS — E1165 Type 2 diabetes mellitus with hyperglycemia: Secondary | ICD-10-CM | POA: Diagnosis not present

## 2016-05-19 DIAGNOSIS — R739 Hyperglycemia, unspecified: Secondary | ICD-10-CM | POA: Diagnosis not present

## 2016-05-19 DIAGNOSIS — I10 Essential (primary) hypertension: Secondary | ICD-10-CM | POA: Diagnosis not present

## 2016-05-19 DIAGNOSIS — E119 Type 2 diabetes mellitus without complications: Secondary | ICD-10-CM | POA: Diagnosis not present

## 2016-05-19 DIAGNOSIS — E1165 Type 2 diabetes mellitus with hyperglycemia: Secondary | ICD-10-CM | POA: Diagnosis not present

## 2016-05-19 DIAGNOSIS — M109 Gout, unspecified: Secondary | ICD-10-CM | POA: Diagnosis not present

## 2016-08-17 IMAGING — US US EXTREM LOW VENOUS*L*
1 series · 14 of 24 positions shown · non-contrast
Comparison: None

CLINICAL DATA: Left leg pain behind knee since [REDACTED]. Edema.
Previous tobacco abuse.

EXAM:
LEFT LOWER EXTREMITY VENOUS DOPPLER ULTRASOUND
TECHNIQUE: Gray-scale sonography with compression, as well as color and duplex
ultrasound, were performed to evaluate the deep venous system from
the level of the common femoral vein through the popliteal and
proximal calf veins.

[Series 1: us extrem low venous*left* · 0.08mm/px · 14 of 39 slices shown]
[im 1/39]
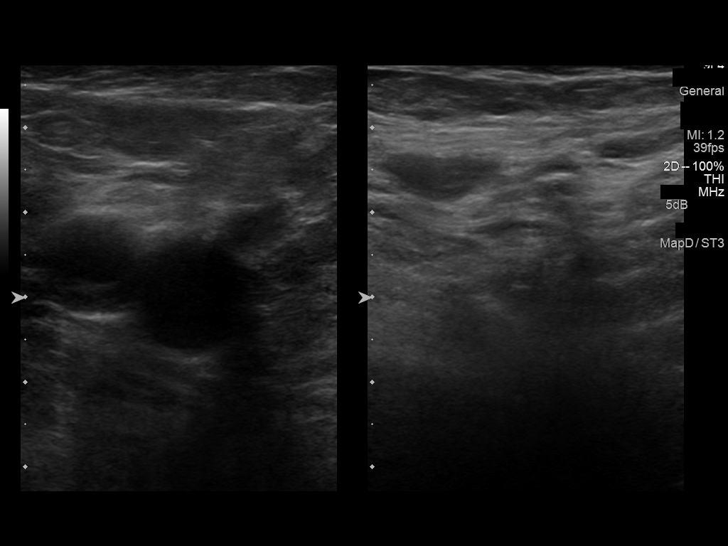
[im 4/39]
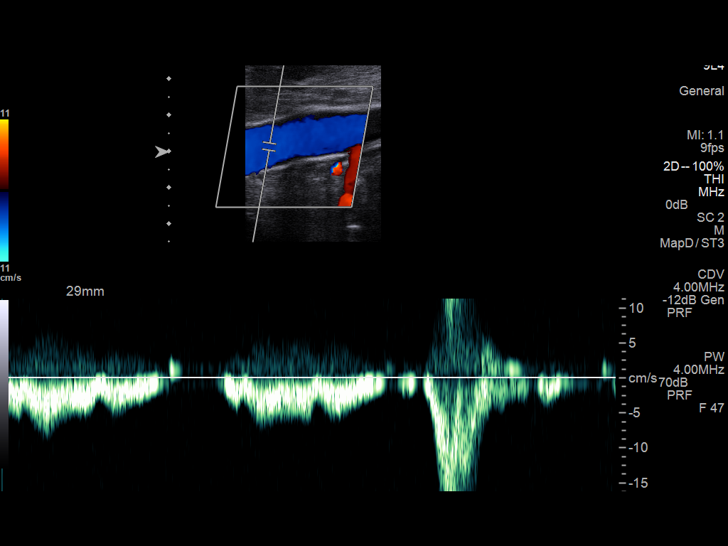
[im 7/39]
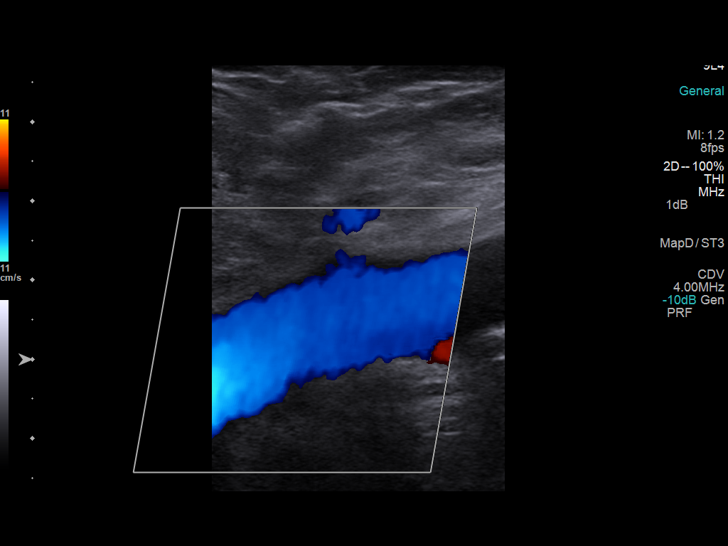
[im 10/39]
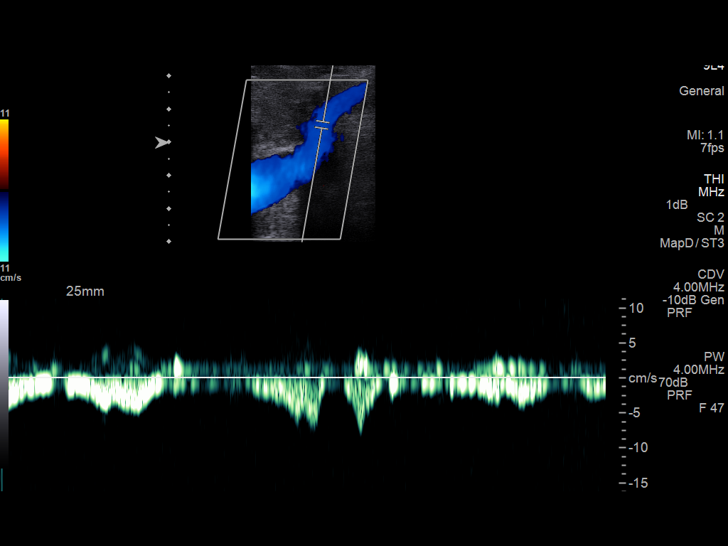
[im 12/39]
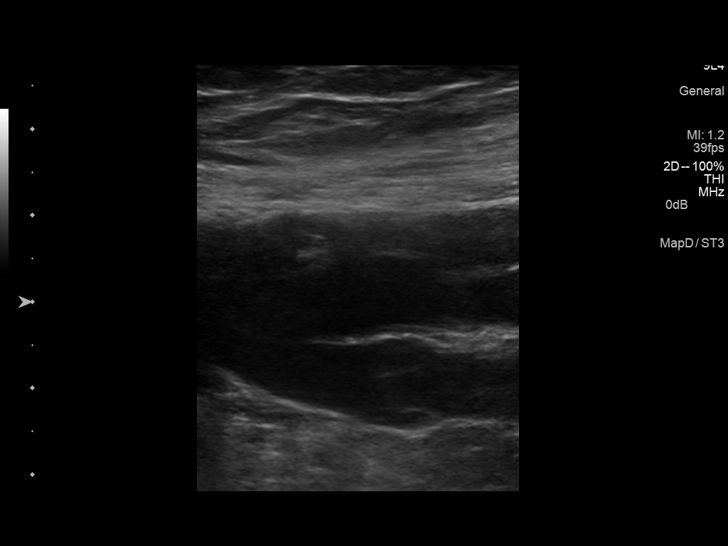
[im 15/39]
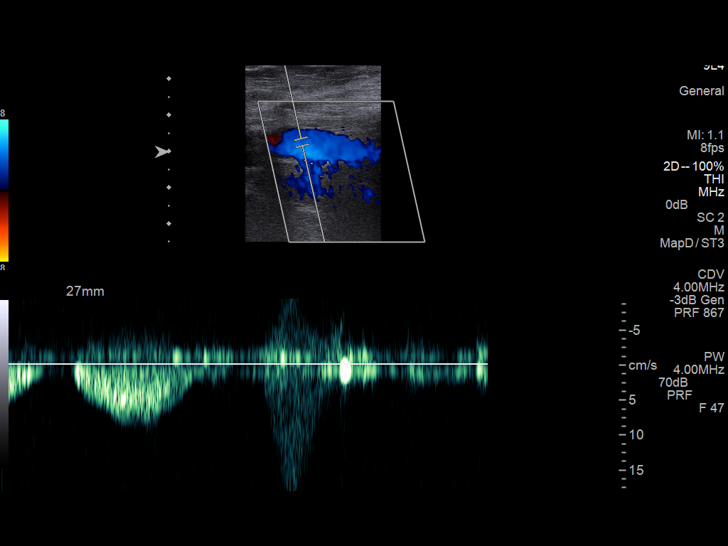
[im 19/39]
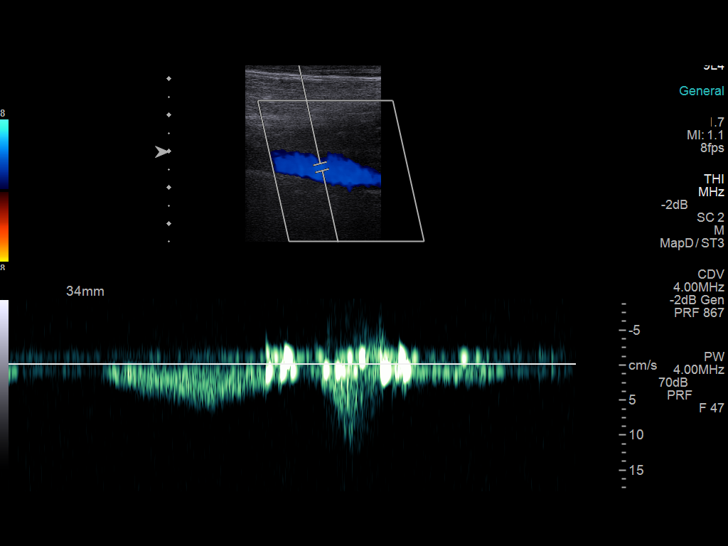
[im 20/39]
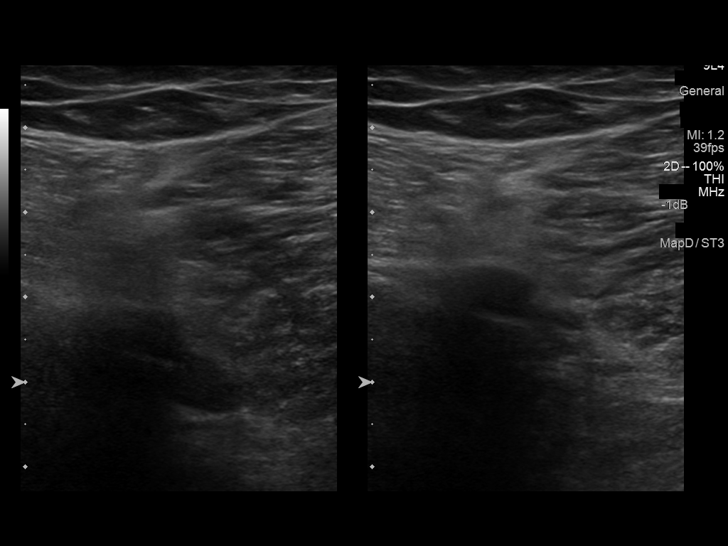
[im 24/39]
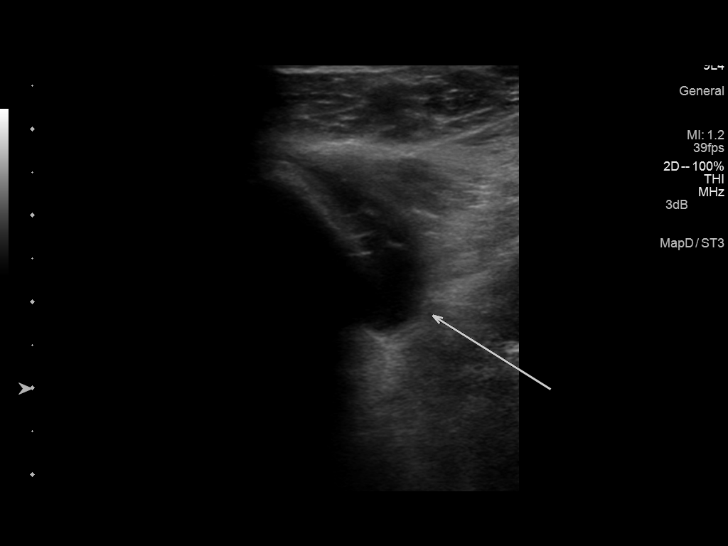
[im 27/39]
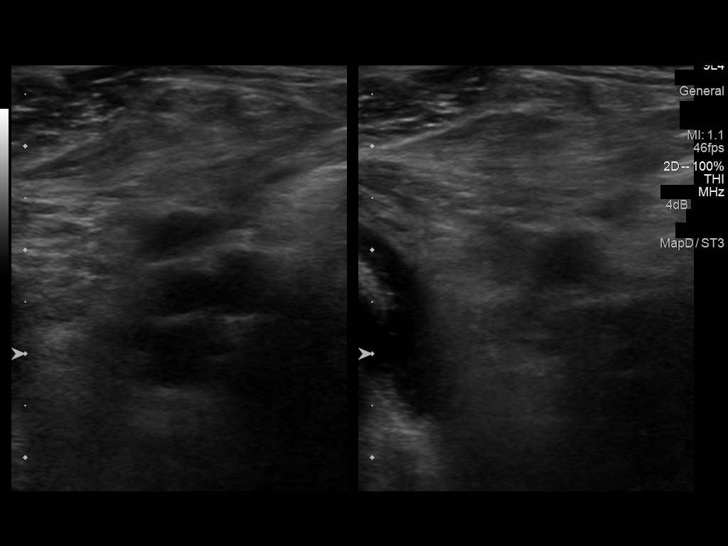
[im 30/39]
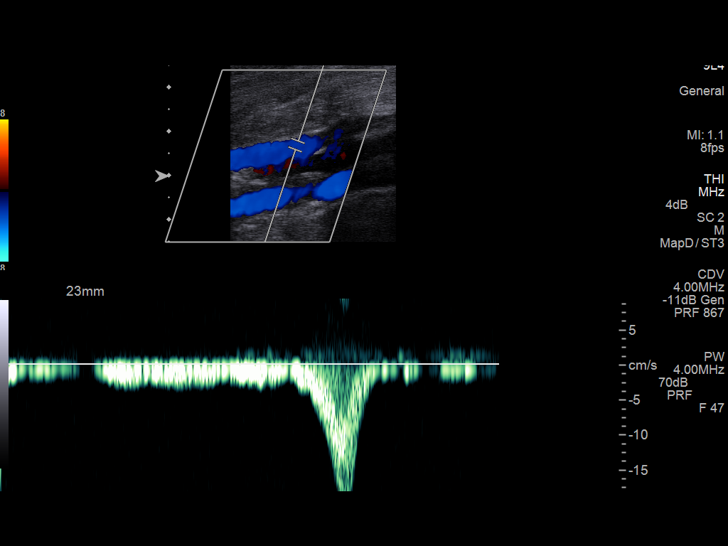
[im 32/39]
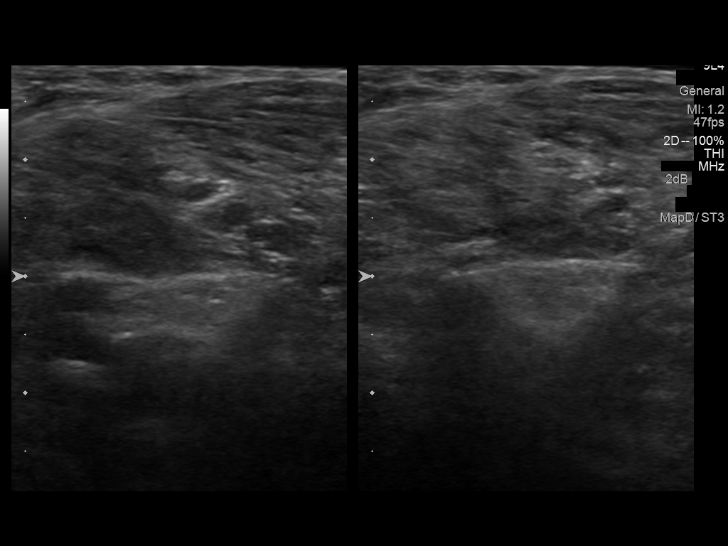
[im 35/39]
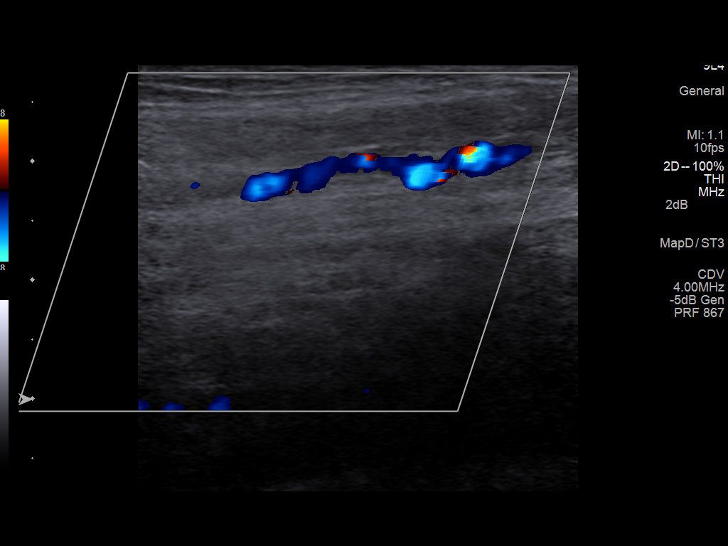
[im 39/39]
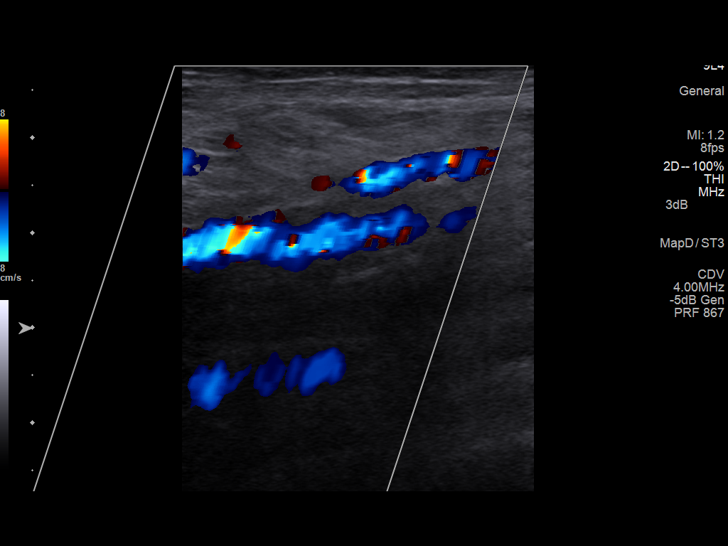

[14 of 24 positions shown; findings below may reference images not displayed]

FINDINGS: Normal compressibility of the common femoral, superficial femoral,
and popliteal veins, as well as the proximal calf veins. No filling
defects to suggest DVT on grayscale or color Doppler imaging.
Doppler waveforms show normal direction of venous flow, normal
respiratory phasicity and response to augmentation. There is a
crescentic fluid collection along the lateral margin of the knee.
Survey views of the contralateral common femoral vein are
unremarkable.
IMPRESSION: 1. No evidence of lower extremity deep vein thrombosis, LEFT.
2. Left knee effusion.

## 2017-05-05 DIAGNOSIS — Z0001 Encounter for general adult medical examination with abnormal findings: Secondary | ICD-10-CM | POA: Diagnosis not present

## 2017-05-05 DIAGNOSIS — Z1389 Encounter for screening for other disorder: Secondary | ICD-10-CM | POA: Diagnosis not present

## 2017-05-05 DIAGNOSIS — E1165 Type 2 diabetes mellitus with hyperglycemia: Secondary | ICD-10-CM | POA: Diagnosis not present

## 2017-05-05 DIAGNOSIS — M109 Gout, unspecified: Secondary | ICD-10-CM | POA: Diagnosis not present

## 2017-05-05 DIAGNOSIS — I10 Essential (primary) hypertension: Secondary | ICD-10-CM | POA: Diagnosis not present

## 2018-12-05 ENCOUNTER — Other Ambulatory Visit: Payer: Self-pay

## 2018-12-05 ENCOUNTER — Other Ambulatory Visit: Payer: Self-pay | Admitting: Internal Medicine

## 2018-12-05 DIAGNOSIS — Z20822 Contact with and (suspected) exposure to covid-19: Secondary | ICD-10-CM

## 2018-12-05 NOTE — Progress Notes (Signed)
[id number] 

## 2018-12-10 LAB — NOVEL CORONAVIRUS, NAA: SARS-CoV-2, NAA: NOT DETECTED

## 2020-03-27 ENCOUNTER — Encounter: Payer: Self-pay | Admitting: Internal Medicine

## 2020-05-03 DIAGNOSIS — Z1389 Encounter for screening for other disorder: Secondary | ICD-10-CM | POA: Diagnosis not present

## 2020-05-03 DIAGNOSIS — Z0001 Encounter for general adult medical examination with abnormal findings: Secondary | ICD-10-CM | POA: Diagnosis not present

## 2020-05-03 DIAGNOSIS — E1165 Type 2 diabetes mellitus with hyperglycemia: Secondary | ICD-10-CM | POA: Diagnosis not present

## 2020-05-03 DIAGNOSIS — N400 Enlarged prostate without lower urinary tract symptoms: Secondary | ICD-10-CM | POA: Diagnosis not present

## 2020-05-03 DIAGNOSIS — Z1331 Encounter for screening for depression: Secondary | ICD-10-CM | POA: Diagnosis not present

## 2020-05-03 DIAGNOSIS — I10 Essential (primary) hypertension: Secondary | ICD-10-CM | POA: Diagnosis not present

## 2020-05-17 ENCOUNTER — Ambulatory Visit (INDEPENDENT_AMBULATORY_CARE_PROVIDER_SITE_OTHER): Payer: Medicare HMO | Admitting: Gastroenterology

## 2020-05-17 ENCOUNTER — Encounter: Payer: Self-pay | Admitting: Gastroenterology

## 2020-05-17 ENCOUNTER — Other Ambulatory Visit: Payer: Self-pay

## 2020-05-17 VITALS — BP 163/81 | HR 86 | Temp 97.3°F | Ht 67.0 in | Wt 178.4 lb

## 2020-05-17 DIAGNOSIS — Z86010 Personal history of colonic polyps: Secondary | ICD-10-CM

## 2020-05-17 NOTE — Progress Notes (Signed)
Primary Care Physician:  Rosita Fire, MD  Primary Gastroenterologist:  Garfield Cornea, MD   Chief Complaint  Patient presents with  . Colonoscopy    Due for tcs    HPI:  Todd [name] is a 74 y.o. male here to schedule surveillance colonoscopy.  He was found to have a 1 cm tubular adenoma removed from the colon back in 2009.  Surveillance colonoscopy in 2016 showed diverticulosis and hyperplastic polyp.  Doing well from a GI standpoint.  Bowel movements are regular.  No blood in the stool or melena.  No abdominal pain.  No upper GI symptoms.  Weight has been stable.  Current Outpatient Medications  Medication Sig Dispense Refill  . amLODipine (NORVASC) 10 MG tablet Take 10 mg by mouth daily.    Marland Kitchen atorvastatin (LIPITOR) 40 MG tablet Take 40 mg by mouth daily.    Marland Kitchen glipiZIDE (GLUCOTROL) 5 MG tablet Take 2.5 mg by mouth daily before breakfast.    . hydrALAZINE (APRESOLINE) 25 MG tablet Take 25 mg by mouth 3 (three) times daily.    Marland Kitchen lisinopril (ZESTRIL) 40 MG tablet Take 40 mg by mouth daily.    . metFORMIN (GLUCOPHAGE) 500 MG tablet Take by mouth 2 (two) times daily with a meal.    . tamsulosin (FLOMAX) 0.4 MG CAPS capsule Take 0.4 mg by mouth daily.     No current facility-administered medications for this visit.    Allergies as of 05/17/2020  . (No Known Allergies)    Past Medical History:  Diagnosis Date  . Ankle fracture   . DM (diabetes mellitus) ([place])   . Gout   . Hypertension     Past Surgical History:  Procedure Laterality Date  . CATARACT EXTRACTION    . COLONOSCOPY  09/13/2007   normal rectum,splenic flexure polyp removed 1 cm, tubular adenoma  . COLONOSCOPY N/A 03/08/2015   Rourk: Diverticulosis, hyperplastic polyp removed. Given history of tubular adenoma in the past, 5-year surveillance colonoscopy recommended.    Family History  Problem Relation Age of Onset  . Colon cancer Neg Hx     Social History   Socioeconomic History  . Marital status:  Married    Spouse name: Not on file  . Number of children: Not on file  . Years of education: Not on file  . Highest education level: Not on file  Occupational History  . Not on file  Tobacco Use  . Smoking status: Current Some Day Smoker  . Smokeless tobacco: Never Used  . Tobacco comment: Smokes "occasionally, not every day."  Substance and Sexual Activity  . Alcohol use: Yes    Alcohol/week: 0.0 standard drinks    Comment: daily. Couple of shots.   . Drug use: No  . Sexual activity: Not on file  Other Topics Concern  . Not on file  Social History Narrative  . Not on file   Social Determinants of Health   Financial Resource Strain: Not on file  Food Insecurity: Not on file  Transportation Needs: Not on file  Physical Activity: Not on file  Stress: Not on file  Social Connections: Not on file  Intimate Partner Violence: Not on file      ROS:  General: Negative for anorexia, weight loss, fever, chills, fatigue, weakness. Eyes: Negative for vision changes.  ENT: Negative for hoarseness, difficulty swallowing , nasal congestion. CV: Negative for chest pain, angina, palpitations, dyspnea on exertion, peripheral edema.  Respiratory: Negative for dyspnea at rest, dyspnea on exertion, cough,  sputum, wheezing.  GI: See history of present illness. GU:  Negative for dysuria, hematuria, urinary incontinence, urinary frequency, nocturnal urination.  MS: Negative for joint pain, low back pain.  Derm: Negative for rash or itching.  Neuro: Negative for weakness, abnormal sensation, seizure, frequent headaches, memory loss, confusion.  Psych: Negative for anxiety, depression, suicidal ideation, hallucinations.  Endo: Negative for unusual weight change.  Heme: Negative for bruising or bleeding. Allergy: Negative for rash or hives.    Physical Examination:  BP (!) 163/81   Pulse 86   Temp (!) 97.3 F (36.3 C) (Temporal)   Ht 5\' 7"  (1.702 m)   Wt 178 lb 6.4 oz (80.9 kg)    BMI 27.94 kg/m    General: Well-nourished, well-developed in no acute distress.  Head: Normocephalic, atraumatic.   Eyes: Conjunctiva pink, no icterus. Mouth: masked Neck: Supple without thyromegaly, masses, or lymphadenopathy.  Lungs: Clear to auscultation bilaterally.  Heart: Regular rate and rhythm, no murmurs rubs or gallops.  Abdomen: Bowel sounds are normal, nontender, nondistended, no hepatosplenomegaly or masses, no abdominal bruits or    hernia , no rebound or guarding.   Rectal: Not performed Extremities: No lower extremity edema. No clubbing or deformities.  Neuro: Alert and oriented x 4 , grossly normal neurologically.  Skin: Warm and dry, no rash or jaundice.   Psych: Alert and cooperative, normal mood and affect.    Imaging Studies: No results found.  Impression/Plan:  74 year old gentleman with history of adenomatous colon polyp in 2009, last colonoscopy in 2016.  Due for surveillance colonoscopy at this time.  No GI symptoms.    Colonoscopy in the near future with Dr. Gala Romney.Drinks a couple of shots of alcohol on a daily basis for years. Plan for deep sedation with propofol. ASA II.  I have discussed the risks, alternatives, benefits with regards to but not limited to the risk of reaction to medication, bleeding, infection, perforation and the patient is agreeable to proceed. Written consent to be obtained.

## 2020-05-17 NOTE — Patient Instructions (Signed)
1. Colonoscopy as scheduled. Please see separate instructions. 

## 2020-05-17 NOTE — Progress Notes (Signed)
CC'ED TO PCP 

## 2020-05-20 ENCOUNTER — Telehealth: Payer: Self-pay

## 2020-05-20 NOTE — Telephone Encounter (Signed)
Spoke to pt, TCS scheduled for 06/21/20 at 10:30am. COVID test 06/19/20 at 1:30pm. Appt letter mailed with procedure instructions. Orders entered.  PA for TCS submitted via [employer] website. [employer]# [id number], valid 06/21/20-07/21/20.

## 2020-05-20 NOTE — Telephone Encounter (Signed)
Tried to call pt to schedule TCS w/Propofol ASA 2 w/Dr. Gala Romney, no answer, [facility] for return call.

## 2020-05-29 ENCOUNTER — Telehealth: Payer: Self-pay

## 2020-05-29 NOTE — Telephone Encounter (Signed)
TCS instructions/covid test appt letter returned in mail: return to sender, refused, unable to forward.  Called pt, informed him envelope was returned. Confirmed address in chart is correct. He will pick up instructions tomorrow. Placed instructions at front desk.

## 2020-06-19 ENCOUNTER — Other Ambulatory Visit: Payer: Self-pay

## 2020-06-19 ENCOUNTER — Other Ambulatory Visit (HOSPITAL_COMMUNITY)
Admission: RE | Admit: 2020-06-19 | Discharge: 2020-06-19 | Disposition: A | Discharge status: Home / Self Care | Payer: Medicare PPO | Source: Ambulatory Visit | Attending: Internal Medicine | Admitting: Internal Medicine

## 2020-06-19 DIAGNOSIS — Z01812 Encounter for preprocedural laboratory examination: Secondary | ICD-10-CM | POA: Diagnosis not present

## 2020-06-19 DIAGNOSIS — Z20822 Contact with and (suspected) exposure to covid-19: Secondary | ICD-10-CM | POA: Diagnosis not present

## 2020-06-19 LAB — BASIC METABOLIC PANEL
Anion gap: 9 (ref 5–15)
BUN: 19 mg/dL (ref 8–23)
CO2: 24 mmol/L (ref 22–32)
Calcium: 9.2 mg/dL (ref 8.9–10.3)
Chloride: 104 mmol/L (ref 98–111)
Creatinine, Ser: 1.38 mg/dL — ABNORMAL HIGH (ref 0.61–1.24)
GFR, Estimated: 54 mL/min — ABNORMAL LOW (ref >60)
Glucose, Bld: 193 mg/dL — ABNORMAL HIGH (ref 70–99)
Potassium: 3.9 mmol/L (ref 3.5–5.1)
Sodium: 137 mmol/L (ref 135–145)

## 2020-06-19 LAB — SARS CORONAVIRUS 2 (TAT 6-24 HRS): SARS Coronavirus 2: NEGATIVE

## 2020-06-20 ENCOUNTER — Telehealth: Payer: Self-pay | Admitting: *Deleted

## 2020-06-20 NOTE — Telephone Encounter (Signed)
Patient procedure was cancelled for 1/21 by endo d/t surge in inpatients at hospital.  Called patient and he has been rescheduled to 2/18 at 10:15am. Aware will mail new prep instructions with new covid test appt. Confirmed mailing address. Called endo and made aware.

## 2020-07-17 ENCOUNTER — Other Ambulatory Visit: Payer: Self-pay

## 2020-07-17 ENCOUNTER — Other Ambulatory Visit (HOSPITAL_COMMUNITY)
Admission: RE | Admit: 2020-07-17 | Discharge: 2020-07-17 | Disposition: A | Discharge status: Home / Self Care | Payer: Medicare PPO | Source: Ambulatory Visit | Attending: Internal Medicine | Admitting: Internal Medicine

## 2020-07-17 DIAGNOSIS — Z20822 Contact with and (suspected) exposure to covid-19: Secondary | ICD-10-CM | POA: Diagnosis not present

## 2020-07-17 DIAGNOSIS — Z01812 Encounter for preprocedural laboratory examination: Secondary | ICD-10-CM | POA: Diagnosis not present

## 2020-07-17 LAB — SARS CORONAVIRUS 2 (TAT 6-24 HRS): SARS Coronavirus 2: NEGATIVE

## 2020-07-19 ENCOUNTER — Encounter (HOSPITAL_COMMUNITY)
Admission: RE | Disposition: A | Discharge status: Home / Self Care | Payer: Self-pay | Source: Home / Self Care | Attending: Internal Medicine

## 2020-07-19 ENCOUNTER — Ambulatory Visit (HOSPITAL_COMMUNITY): Payer: Medicare PPO | Admitting: Anesthesiology

## 2020-07-19 ENCOUNTER — Encounter (HOSPITAL_COMMUNITY): Payer: Self-pay | Admitting: Internal Medicine

## 2020-07-19 ENCOUNTER — Other Ambulatory Visit: Payer: Self-pay

## 2020-07-19 ENCOUNTER — Ambulatory Visit (HOSPITAL_COMMUNITY)
Admission: RE | Admit: 2020-07-19 | Discharge: 2020-07-19 | Disposition: A | Discharge status: Home / Self Care | Payer: Medicare PPO | Attending: Internal Medicine | Admitting: Internal Medicine

## 2020-07-19 DIAGNOSIS — Z7984 Long term (current) use of oral hypoglycemic drugs: Secondary | ICD-10-CM | POA: Insufficient documentation

## 2020-07-19 DIAGNOSIS — D122 Benign neoplasm of ascending colon: Secondary | ICD-10-CM | POA: Insufficient documentation

## 2020-07-19 DIAGNOSIS — Z1211 Encounter for screening for malignant neoplasm of colon: Secondary | ICD-10-CM | POA: Diagnosis not present

## 2020-07-19 DIAGNOSIS — K635 Polyp of colon: Secondary | ICD-10-CM

## 2020-07-19 DIAGNOSIS — F17200 Nicotine dependence, unspecified, uncomplicated: Secondary | ICD-10-CM | POA: Diagnosis not present

## 2020-07-19 DIAGNOSIS — Z86010 Personal history of colonic polyps: Secondary | ICD-10-CM | POA: Diagnosis not present

## 2020-07-19 DIAGNOSIS — Z79899 Other long term (current) drug therapy: Secondary | ICD-10-CM | POA: Diagnosis not present

## 2020-07-19 DIAGNOSIS — E119 Type 2 diabetes mellitus without complications: Secondary | ICD-10-CM | POA: Diagnosis not present

## 2020-07-19 HISTORY — PX: POLYPECTOMY: SHX5525

## 2020-07-19 HISTORY — PX: COLONOSCOPY WITH PROPOFOL: SHX5780

## 2020-07-19 LAB — GLUCOSE, CAPILLARY: Glucose-Capillary: 171 mg/dL — ABNORMAL HIGH (ref 70–99)

## 2020-07-19 SURGERY — COLONOSCOPY WITH PROPOFOL
Anesthesia: General

## 2020-07-19 MED ORDER — PROPOFOL 10 MG/ML IV BOLUS
INTRAVENOUS | Status: DC | PRN
  Administered 2020-07-19: 50 mg via INTRAVENOUS

## 2020-07-19 MED ORDER — LACTATED RINGERS IV SOLN: INTRAVENOUS | Status: DC

## 2020-07-19 MED ORDER — PROPOFOL 500 MG/50ML IV EMUL
INTRAVENOUS | Status: DC | PRN
  Administered 2020-07-19: 150 ug/kg/min via INTRAVENOUS

## 2020-07-19 MED ORDER — PROPOFOL 10 MG/ML IV BOLUS
INTRAVENOUS | Status: AC
  Filled 2020-07-19: qty 40

## 2020-07-19 MED ORDER — STERILE WATER FOR IRRIGATION IR SOLN
Status: DC | PRN
  Administered 2020-07-19: 100 mL

## 2020-07-19 NOTE — Anesthesia Postprocedure Evaluation (Signed)
Anesthesia Post Note  Patient: Todd [name]  Procedure(s) Performed: COLONOSCOPY WITH PROPOFOL (N/A ) POLYPECTOMY  Patient location during evaluation: Phase II Anesthesia Type: General Level of consciousness: awake, oriented, awake and alert and patient cooperative Pain management: satisfactory to patient Vital Signs Assessment: post-procedure vital signs reviewed and stable Respiratory status: spontaneous breathing, respiratory function stable and nonlabored ventilation Cardiovascular status: stable Postop Assessment: no apparent nausea or vomiting Anesthetic complications: no   No complications documented.   Last Vitals:  Vitals:   07/19/20 0848  BP: (!) 175/77  Pulse: 81  Resp: 19  Temp: 36.8 C  SpO2: 99%    Last Pain:  Vitals:   07/19/20 0927  TempSrc:   PainSc: 0-No pain                 Aerith Canal

## 2020-07-19 NOTE — Op Note (Signed)
[facility] Patient Name: Todd [name] Procedure Date: 07/19/2020 9:19 AM MRN: [id number] Date of Birth: 04-11-1946 Attending MD: Norvel Richards , MD CSN: [id number] Age: 75 Admit Type: Outpatient Procedure:                Colonoscopy Indications:              High risk colon cancer surveillance: Personal                            history of colonic polyps Providers:                Norvel Richards, MD, Rosina Lowenstein, RN, Nelma Rothman, Technician Referring MD:              Medicines:                Propofol per Anesthesia Complications:            No immediate complications. Estimated Blood Loss:     Estimated blood loss was minimal. Procedure:                Pre-Anesthesia Assessment:                           - Prior to the procedure, a History and Physical                            was performed, and patient medications and                            allergies were reviewed. The patient's tolerance of                            previous anesthesia was also reviewed. The risks                            and benefits of the procedure and the sedation                            options and risks were discussed with the patient.                            All questions were answered, and informed consent                            was obtained. Prior Anticoagulants: The patient has                            taken no previous anticoagulant or antiplatelet                            agents. ASA Grade Assessment: II - A patient with  mild systemic disease. After reviewing the risks                            and benefits, the patient was deemed in                            satisfactory condition to undergo the procedure.                           After obtaining informed consent, the colonoscope                            was passed under direct vision. Throughout the                            procedure, the patient's  blood pressure, pulse, and                            oxygen saturations were monitored continuously. The                            CF-[id number] ([id number]) scope was introduced through                            the anus and advanced to the the cecum, identified                            by appendiceal orifice and ileocecal valve. The                            colonoscopy was performed without difficulty. The                            patient tolerated the procedure well. The quality                            of the bowel preparation was adequate. Scope In: 9:33:57 AM Scope Out: 9:47:23 AM Scope Withdrawal Time: 0 hours 10 minutes 17 seconds  Total Procedure Duration: 0 hours 13 minutes 26 seconds  Findings:      The perianal and digital rectal examinations were normal.      A 4 mm polyp was found in the ascending colon. The polyp was sessile.       The polyp was removed with a cold snare. Resection and retrieval were       complete. Estimated blood loss was minimal.      The exam was otherwise without abnormality on direct and retroflexion       views. Impression:               - One 4 mm polyp in the ascending colon, removed                            with a cold snare. Resected and retrieved.                           -  The examination was otherwise normal on direct                            and retroflexion views. Moderate Sedation:      Moderate (conscious) sedation was personally administered by an       anesthesia professional. The following parameters were monitored: oxygen       saturation, heart rate, blood pressure, respiratory rate, EKG, adequacy       of pulmonary ventilation, and response to care. Recommendation:           - Patient has a contact number available for                            emergencies. The signs and symptoms of potential                            delayed complications were discussed with the                            patient. Return to normal  activities tomorrow.                            Written discharge instructions were provided to the                            patient.                           - Advance diet as tolerated.                           - Patient has a contact number available for                            emergencies. The signs and symptoms of potential                            delayed complications were discussed with the                            patient. Return to normal activities tomorrow.                            Written discharge instructions were provided to the                            patient.                           - Continue present medications.                           - Repeat colonoscopy date to be determined after                            pending pathology results are reviewed for  surveillance.                           - Return to GI clinic (date not yet determined). Procedure Code(s):        --- Professional ---                           [id number], Colonoscopy, flexible; with removal of                            tumor(s), polyp(s), or other lesion(s) by snare                            technique Diagnosis Code(s):        --- Professional ---                           Z86.010, Personal history of colonic polyps                           K63.5, Polyp of colon CPT copyright 2019 American Medical Association. All rights reserved. The codes documented in this report are preliminary and upon coder review may  be revised to meet current compliance requirements. Cristopher Estimable. Lalonnie Shaffer, MD Norvel Richards, MD 07/19/2020 9:54:08 AM This report has been signed electronically. Number of Addenda: 0

## 2020-07-19 NOTE — Anesthesia Preprocedure Evaluation (Addendum)
Anesthesia Evaluation  Patient identified by MRN, date of birth, ID band Patient awake    Reviewed: Allergy & Precautions, NPO status , Patient's Chart, lab work & pertinent test results  History of Anesthesia Complications Negative for: history of anesthetic complications  Airway Mallampati: II  TM Distance: >3 FB Neck ROM: Full    Dental  (+) Dental Advisory Given, Partial Lower   Pulmonary Current Smoker and Patient abstained from smoking.,    Pulmonary exam normal breath sounds clear to auscultation       Cardiovascular Exercise Tolerance: Good hypertension, Pt. on medications Normal cardiovascular exam Rhythm:Regular Rate:Normal     Neuro/Psych negative neurological ROS  negative psych ROS   GI/Hepatic negative GI ROS, Neg liver ROS,   Endo/Other  diabetes, Well Controlled, Type 2, Oral Hypoglycemic Agents  Renal/GU negative Renal ROS     Musculoskeletal negative musculoskeletal ROS (+)   Abdominal   Peds  Hematology negative hematology ROS (+)   Anesthesia Other Findings   Reproductive/Obstetrics negative OB ROS                            Anesthesia Physical Anesthesia Plan  ASA: II  Anesthesia Plan: General   Post-op Pain Management:    Induction: Intravenous  PONV Risk Score and Plan: TIVA  Airway Management Planned: Nasal Cannula and Natural Airway  Additional Equipment:   Intra-op Plan:   Post-operative Plan:   Informed Consent: I have reviewed the patients History and Physical, chart, labs and discussed the procedure including the risks, benefits and alternatives for the proposed anesthesia with the patient or authorized representative who has indicated his/her understanding and acceptance.     Dental advisory given  Plan Discussed with: Surgeon and CRNA  Anesthesia Plan Comments:        Anesthesia Quick Evaluation

## 2020-07-19 NOTE — Discharge Instructions (Signed)
Colonoscopy Discharge Instructions  Read the instructions outlined below and refer to this sheet in the next few weeks. These discharge instructions provide you with general information on caring for yourself after you leave the hospital. Your doctor may also give you specific instructions. While your treatment has been planned according to the most current medical practices available, unavoidable complications occasionally occur. If you have any problems or questions after discharge, call Dr. Gala Romney at [phone]. ACTIVITY  You may resume your regular activity, but move at a slower pace for the next 24 hours.   Take frequent rest periods for the next 24 hours.   Walking will help get rid of the air and reduce the bloated feeling in your belly (abdomen).   No driving for 24 hours (because of the medicine (anesthesia) used during the test).    Do not sign any important legal documents or operate any machinery for 24 hours (because of the anesthesia used during the test).  NUTRITION  Drink plenty of fluids.   You may resume your normal diet as instructed by your doctor.   Begin with a light meal and progress to your normal diet. Heavy or fried foods are harder to digest and may make you feel sick to your stomach (nauseated).   Avoid alcoholic beverages for 24 hours or as instructed.  MEDICATIONS  You may resume your normal medications unless your doctor tells you otherwise.  WHAT YOU CAN EXPECT TODAY  Some feelings of bloating in the abdomen.   Passage of more gas than usual.   Spotting of blood in your stool or on the toilet paper.  IF YOU HAD POLYPS REMOVED DURING THE COLONOSCOPY:  No aspirin products for 7 days or as instructed.   No alcohol for 7 days or as instructed.   Eat a soft diet for the next 24 hours.  FINDING OUT THE RESULTS OF YOUR TEST Not all test results are available during your visit. If your test results are not back during the visit, make an appointment  with your caregiver to find out the results. Do not assume everything is normal if you have not heard from your caregiver or the medical facility. It is important for you to follow up on all of your test results.  SEEK IMMEDIATE MEDICAL ATTENTION IF:  You have more than a spotting of blood in your stool.   Your belly is swollen (abdominal distention).   You are nauseated or vomiting.   You have a temperature over 101.   You have abdominal pain or discomfort that is severe or gets worse throughout the day.    1 polyp removed from your colon today  Further recommendations to follow pending review of pathology report  Patient request I called Judeth Porch at [phone] -left information on voicemail   Colon Polyps  Colon polyps are tissue growths inside the colon, which is part of the large intestine. They are one of the types of polyps that can grow in the body. A polyp may be a round bump or a mushroom-shaped growth. You could have one polyp or more than one. Most colon polyps are noncancerous (benign). However, some colon polyps can become cancerous over time. Finding and removing the polyps early can help prevent this. What are the causes? The exact cause of colon polyps is not known. What increases the risk? The following factors may make you more likely to develop this condition:  Having a family history of colorectal cancer or colon polyps.  Being  older than 73 years of age.  Being younger than 73 years of age and having a significant family history of colorectal cancer or colon polyps or a genetic condition that puts you at higher risk of getting colon polyps.  Having inflammatory bowel disease, such as ulcerative colitis or Crohn's disease.  Having certain conditions passed from parent to child (hereditary conditions), such as: ? Familial adenomatous polyposis (FAP). ? Lynch syndrome. ? Turcot syndrome. ? Peutz-Jeghers syndrome. ? MUTYH-associated polyposis  (MAP).  Being overweight.  Certain lifestyle factors. These include smoking cigarettes, drinking too much alcohol, not getting enough exercise, and eating a diet that is high in fat and red meat and low in fiber.  Having had childhood cancer that was treated with radiation of the abdomen. What are the signs or symptoms? Many times, there are no symptoms. If you have symptoms, they may include:  Blood coming from the rectum during a bowel movement.  Blood in the stool (feces). The blood may be bright red or very dark in color.  Pain in the abdomen.  A change in bowel habits, such as constipation or diarrhea. How is this diagnosed? This condition is diagnosed with a colonoscopy. This is a procedure in which a lighted, flexible scope is inserted into the opening between the buttocks (anus) and then passed into the colon to examine the area. Polyps are sometimes found when a colonoscopy is done as part of routine cancer screening tests. How is this treated? This condition is treated by removing any polyps that are found. Most polyps can be removed during a colonoscopy. Those polyps will then be tested for cancer. Additional treatment may be needed depending on the results of testing. Follow these instructions at home: Eating and drinking  Eat foods that are high in fiber, such as fruits, vegetables, and whole grains.  Eat foods that are high in calcium and vitamin D, such as milk, cheese, yogurt, eggs, liver, fish, and broccoli.  Limit foods that are high in fat, such as fried foods and desserts.  Limit the amount of red meat, precooked or cured meat, or other processed meat that you eat, such as hot dogs, sausages, bacon, or meat loaves.  Limit sugary drinks.   Lifestyle  Maintain a healthy weight, or lose weight if recommended by your health care provider.  Exercise every day or as told by your health care provider.  Do not use any products that contain nicotine or tobacco, such  as cigarettes, e-cigarettes, and chewing tobacco. If you need help quitting, ask your health care provider.  Do not drink alcohol if: ? Your health care provider tells you not to drink. ? You are pregnant, may be pregnant, or are planning to become pregnant.  If you drink alcohol: ? Limit how much you use to:  0-1 drink a day for women.  0-2 drinks a day for men. ? Know how much alcohol is in your drink. In the [place] one drink equals one 12 oz bottle of beer (355 mL), one 5 oz glass of wine (148 mL), or one 1 oz glass of hard liquor (44 mL). General instructions  Take over-the-counter and prescription medicines only as told by your health care provider.  Keep all follow-up visits. This is important. This includes having regularly scheduled colonoscopies. Talk to your health care provider about when you need a colonoscopy. Contact a health care provider if:  You have new or worsening bleeding during a bowel movement.  You have new or  increased blood in your stool.  You have a change in bowel habits.  You lose weight for no known reason. Summary  Colon polyps are tissue growths inside the colon, which is part of the large intestine. They are one type of polyp that can grow in the body.  Most colon polyps are noncancerous (benign), but some can become cancerous over time.  This condition is diagnosed with a colonoscopy.  This condition is treated by removing any polyps that are found. Most polyps can be removed during a colonoscopy. This information is not intended to replace advice given to you by your health care provider. Make sure you discuss any questions you have with your health care provider. Document Revised: 09/06/2019 Document Reviewed: 09/06/2019 Elsevier Patient Education  2021 [employer].

## 2020-07-19 NOTE — H&P (Signed)
@LOGO @   Primary Care Physician:  Rosita Fire, MD Primary Gastroenterologist:  Dr. Gala Romney  Pre-Procedure History & Physical: HPI:  Todd [name] is a 75 y.o. male here for surveillance colonoscopy.  History of 1 cm adenoma removed 2009; negative colonoscopy 2016; here for surveillance colonoscopy per plan.  No symptoms currently.  Past Medical History:  Diagnosis Date  . Ankle fracture   . DM (diabetes mellitus) ([place])   . Gout   . Hypertension     Past Surgical History:  Procedure Laterality Date  . CATARACT EXTRACTION    . COLONOSCOPY  09/13/2007   normal rectum,splenic flexure polyp removed 1 cm, tubular adenoma  . COLONOSCOPY N/A 03/08/2015   Mykeal Carrick: Diverticulosis, hyperplastic polyp removed. Given history of tubular adenoma in the past, 5-year surveillance colonoscopy recommended.    Prior to Admission medications   Medication Sig Start Date End Date Taking? Authorizing Provider  amLODipine (NORVASC) 10 MG tablet Take 10 mg by mouth daily.   Yes [provider]  atorvastatin (LIPITOR) 40 MG tablet Take 40 mg by mouth daily.   Yes [provider]  glipiZIDE (GLUCOTROL) 5 MG tablet Take 2.5 mg by mouth daily before breakfast.   Yes [provider]  hydrALAZINE (APRESOLINE) 25 MG tablet Take 25 mg by mouth 3 (three) times daily.   Yes [provider]  lisinopril (ZESTRIL) 40 MG tablet Take 40 mg by mouth daily.   Yes [provider]  metFORMIN (GLUCOPHAGE) 500 MG tablet Take 500 mg by mouth 2 (two) times daily with a meal.   Yes [provider]  tamsulosin (FLOMAX) 0.4 MG CAPS capsule Take 0.4 mg by mouth daily.   Yes [provider]    Allergies as of 05/20/2020  . (No Known Allergies)    Family History  Problem Relation Age of Onset  . Colon cancer Neg Hx     Social History   Socioeconomic History  . Marital status: Married    Spouse name: Not on file  . Number of children: Not on file  . Years of  education: Not on file  . Highest education level: Not on file  Occupational History  . Not on file  Tobacco Use  . Smoking status: Current Some Day Smoker  . Smokeless tobacco: Never Used  . Tobacco comment: Smokes "occasionally, not every day."  Substance and Sexual Activity  . Alcohol use: Yes    Alcohol/week: 0.0 standard drinks    Comment: daily. Couple of shots.   . Drug use: No  . Sexual activity: Not on file  Other Topics Concern  . Not on file  Social History Narrative  . Not on file   Social Determinants of Health   Financial Resource Strain: Not on file  Food Insecurity: Not on file  Transportation Needs: Not on file  Physical Activity: Not on file  Stress: Not on file  Social Connections: Not on file  Intimate Partner Violence: Not on file    Review of Systems: See HPI, otherwise negative ROS  Physical Exam: BP (!) 175/77   Pulse 81   Temp 98.3 F (36.8 C) (Oral)   Resp 19   Ht 5\' 7"  (1.702 m)   Wt 76.7 kg   SpO2 99%   BMI 26.47 kg/m  General:   Alert,  Well-developed, well-nourished, pleasant and cooperative in NAD Neck:  Supple; no masses or thyromegaly. No significant cervical adenopathy. Lungs:  Clear throughout to auscultation.   No wheezes, crackles, or  rhonchi. No acute distress. Heart:  Regular rate and rhythm; no murmurs, clicks, rubs,  or gallops. Abdomen: Non-distended, normal bowel sounds.  Soft and nontender without appreciable mass or hepatosplenomegaly.  Pulses:  Normal pulses noted. Extremities:  Without clubbing or edema.  Impression/Plan: 75 year old gentleman here for surveillance colonoscopy.  History of colonic adenoma.  Have offered the patient a surveillance colonoscopy per plan. The risks, benefits, limitations, alternatives and imponderables have been reviewed with the patient. Questions have been answered. All parties are agreeable.     Notice: This dictation was prepared with Dragon dictation along with smaller phrase  technology. Any transcriptional errors that result from this process are unintentional and may not be corrected upon review.

## 2020-07-19 NOTE — Transfer of Care (Signed)
Immediate Anesthesia Transfer of Care Note  Patient: Todd [name]  Procedure(s) Performed: COLONOSCOPY WITH PROPOFOL (N/A ) POLYPECTOMY  Patient Location: PACU  Anesthesia Type:General  Level of Consciousness: awake, alert , oriented and patient cooperative  Airway & Oxygen Therapy: Patient Spontanous Breathing  Post-op Assessment: Report given to RN, Post -op Vital signs reviewed and stable and Patient moving all extremities X 4  Post vital signs: Reviewed and stable  Last Vitals:  Vitals Value Taken Time  BP    Temp    Pulse    Resp    SpO2      Last Pain:  Vitals:   07/19/20 0927  TempSrc:   PainSc: 0-No pain      Patients Stated Pain Goal: 9 (42/87/68 1157)  Complications: No complications documented.

## 2020-07-19 NOTE — Addendum Note (Signed)
Addendum  created 07/19/20 1419 by Jonna Munro, CRNA   Charge Capture section accepted

## 2020-07-22 ENCOUNTER — Encounter: Payer: Self-pay | Admitting: Internal Medicine

## 2020-07-22 LAB — SURGICAL PATHOLOGY

## 2020-07-24 ENCOUNTER — Encounter (HOSPITAL_COMMUNITY): Payer: Self-pay | Admitting: Internal Medicine

## 2021-01-02 DIAGNOSIS — I10 Essential (primary) hypertension: Secondary | ICD-10-CM | POA: Diagnosis not present

## 2021-01-02 DIAGNOSIS — N400 Enlarged prostate without lower urinary tract symptoms: Secondary | ICD-10-CM | POA: Diagnosis not present

## 2021-01-02 DIAGNOSIS — E1165 Type 2 diabetes mellitus with hyperglycemia: Secondary | ICD-10-CM | POA: Diagnosis not present

## 2021-01-02 DIAGNOSIS — M1000 Idiopathic gout, unspecified site: Secondary | ICD-10-CM | POA: Diagnosis not present

## 2021-09-17 ENCOUNTER — Other Ambulatory Visit: Payer: Self-pay

## 2021-09-17 ENCOUNTER — Emergency Department (HOSPITAL_COMMUNITY)
Admission: EM | Admit: 2021-09-17 | Discharge: 2021-09-17 | Disposition: A | Discharge status: Home / Self Care | Payer: Medicare PPO | Attending: Emergency Medicine | Admitting: Emergency Medicine

## 2021-09-17 ENCOUNTER — Encounter (HOSPITAL_COMMUNITY): Payer: Self-pay

## 2021-09-17 DIAGNOSIS — Z79899 Other long term (current) drug therapy: Secondary | ICD-10-CM | POA: Diagnosis not present

## 2021-09-17 DIAGNOSIS — I10 Essential (primary) hypertension: Secondary | ICD-10-CM | POA: Insufficient documentation

## 2021-09-17 DIAGNOSIS — T464X5A Adverse effect of angiotensin-converting-enzyme inhibitors, initial encounter: Secondary | ICD-10-CM | POA: Diagnosis not present

## 2021-09-17 DIAGNOSIS — E1165 Type 2 diabetes mellitus with hyperglycemia: Secondary | ICD-10-CM | POA: Diagnosis not present

## 2021-09-17 DIAGNOSIS — R220 Localized swelling, mass and lump, head: Secondary | ICD-10-CM | POA: Diagnosis present

## 2021-09-17 DIAGNOSIS — T783XXA Angioneurotic edema, initial encounter: Secondary | ICD-10-CM | POA: Diagnosis not present

## 2021-09-17 MED ORDER — DIPHENHYDRAMINE HCL 50 MG/ML IJ SOLN
25.0000 mg | Freq: Once | INTRAMUSCULAR | Status: AC
  Administered 2021-09-17: 25 mg via INTRAVENOUS
  Filled 2021-09-17: qty 1

## 2021-09-17 MED ORDER — FAMOTIDINE IN NACL 20-0.9 MG/50ML-% IV SOLN
20.0000 mg | INTRAVENOUS | Status: AC
  Administered 2021-09-17: 20 mg via INTRAVENOUS
  Filled 2021-09-17: qty 50

## 2021-09-17 MED ORDER — METHYLPREDNISOLONE SODIUM SUCC 125 MG IJ SOLR
125.0000 mg | Freq: Once | INTRAMUSCULAR | Status: AC
  Administered 2021-09-17: 125 mg via INTRAVENOUS
  Filled 2021-09-17: qty 2

## 2021-09-17 NOTE — ED Provider Notes (Signed)
?[place] ?Provider Note ? ? ?CSN: [id number] ?Arrival date & time: 09/17/21  1036 ? ?  ? ?History ? ?Chief Complaint  ?Patient presents with  ? Facial Swelling  ? ? ?Todd [name] is a 76 y.o. male. ? ?HPI ? ?This patient is a 76 year old male currently taking lisinopril for high blood pressure, presents to the hospital after developing acute onset of swelling of his upper and lower lips yesterday afternoon and then worsening this morning.  He does not have any swelling of his tongue or difficulty with his airway, no shortness of breath or wheezing, no other symptoms including itching.  Patient has had no other medications this morning ? ?He has been on lisinopril for several years ? ?Home Medications ?Prior to Admission medications   ?Medication Sig Start Date End Date Taking? Authorizing Provider  ?amLODipine (NORVASC) 10 MG tablet Take 10 mg by mouth daily.    [provider]  ?atorvastatin (LIPITOR) 40 MG tablet Take 40 mg by mouth daily.    [provider]  ?glipiZIDE (GLUCOTROL) 5 MG tablet Take 2.5 mg by mouth daily before breakfast.    [provider]  ?hydrALAZINE (APRESOLINE) 25 MG tablet Take 25 mg by mouth 3 (three) times daily.    [provider]  ?metFORMIN (GLUCOPHAGE) 500 MG tablet Take 500 mg by mouth 2 (two) times daily with a meal.    [provider]  ?tamsulosin (FLOMAX) 0.4 MG CAPS capsule Take 0.4 mg by mouth daily.    [provider]  ?   ? ?Allergies    ?Lisinopril   ? ?Review of Systems   ?Review of Systems  ?All other systems reviewed and are negative. ? ?Physical Exam ?Updated Vital Signs ?BP (!) 170/94   Pulse 71   Temp 98 ?F (36.7 ?C) (Oral)   Resp 19   Ht 1.702 m ('5\' 7"'$ )   Wt 74.8 kg   SpO2 100%   BMI 25.84 kg/m?  ?Physical Exam ?Vitals and nursing note reviewed.  ?Constitutional:   ?   General: He is not in acute distress. ?   Appearance: He is well-developed.  ?HENT:  ?   Head: Normocephalic and  atraumatic.  ?   Comments: Obvious angioedema of the upper and lower lips but nothing intraoral including a normal-appearing tongue and phonation and voice ?   Nose: Nose normal. No congestion or rhinorrhea.  ?   Mouth/Throat:  ?   Mouth: Mucous membranes are moist.  ?   Pharynx: Oropharynx is clear. No oropharyngeal exudate or posterior oropharyngeal erythema.  ?Eyes:  ?   General: No scleral icterus.    ?   Right eye: No discharge.     ?   Left eye: No discharge.  ?   Conjunctiva/sclera: Conjunctivae normal.  ?   Pupils: Pupils are equal, round, and reactive to light.  ?Neck:  ?   Thyroid: No thyromegaly.  ?   Vascular: No JVD.  ?Cardiovascular:  ?   Rate and Rhythm: Normal rate and regular rhythm.  ?   Heart sounds: Normal heart sounds. No murmur heard. ?  No friction rub. No gallop.  ?Pulmonary:  ?   Effort: Pulmonary effort is normal. No respiratory distress.  ?   Breath sounds: Normal breath sounds. No wheezing or rales.  ?Abdominal:  ?   General: Bowel sounds are normal. There is no distension.  ?   Palpations: Abdomen is soft. There is no mass.  ?  Tenderness: There is no abdominal tenderness.  ?Musculoskeletal:     ?   General: No tenderness. Normal range of motion.  ?   Cervical back: Normal range of motion and neck supple.  ?Lymphadenopathy:  ?   Cervical: No cervical adenopathy.  ?Skin: ?   General: Skin is warm and dry.  ?   Findings: No erythema or rash.  ?Neurological:  ?   General: No focal deficit present.  ?   Mental Status: He is alert.  ?   Coordination: Coordination normal.  ?Psychiatric:     ?   Behavior: Behavior normal.  ? ? ?ED Results / Procedures / Treatments   ?Labs ?(all labs ordered are listed, but only abnormal results are displayed) ?Labs Reviewed - No data to display ? ?EKG ?None ? ?Radiology ?No results found. ? ?Procedures ?Procedures  ? ? ?Medications Ordered in ED ?Medications  ?diphenhydrAMINE (BENADRYL) injection 25 mg (25 mg Intravenous Given 09/17/21 1100)  ?famotidine  (PEPCID) IVPB 20 mg premix (0 mg Intravenous Stopped 09/17/21 1124)  ?methylPREDNISolone sodium succinate (SOLU-MEDROL) 125 mg/2 mL injection 125 mg (125 mg Intravenous Given 09/17/21 1100)  ? ? ?ED Course/ Medical Decision Making/ A&P ?  ?                        ?Medical Decision Making ?Risk ?Prescription drug management. ? ? ?Acute angioedema likely related to an ACE inhibitor.  The patient was counseled at length on the use of this medication, he will get some medications including Solu-Medrol Benadryl and Pepcid and a short observation to make sure he is not getting any worse.  There is no oral swelling visible. ? ?Medication management: Patient was given Solu-Medrol Benadryl and Pepcid ? ?ED course: Patient was observed in the emergency department for several hours without any worsening of his symptoms and no oral involvement, he appears stable for discharge, he was counseled extensively on his use of this medication and the need to stop and the need to follow-up with his doctor.  He is already on amlodipine 10 mg daily as well as hydralazine 25 3 times daily ? ?The patient was reevaluated several times over 2-1/2 hours and had no worsening of his symptoms and no difficulty with his tongue swallowing or breathing.  He has contacted his family doctor's office and they want to see him immediately upon discharge to help rearrange his blood pressure medications.  I have discussed with the patient the need for coming back to the ER including worsening symptoms or signs of intraoral angioedema, he is agreeable and stable for discharge ? ?Lisinopril was added to his allergy list ? ?We will also start hydrochlorothiazide to offset his hypertension ? ? ? ? ? ? ? ?Final Clinical Impression(s) / ED Diagnoses ?Final diagnoses:  ?Angioedema, initial encounter  ? ? ?Rx / DC Orders ?ED Discharge Orders   ? ? None  ? ?  ? ? ?  ?Noemi Chapel, MD ?09/17/21 1316 ? ?

## 2021-09-17 NOTE — ED Notes (Signed)
Lips remain swollen. No tongue swelling noted. No trouble swallowing or difficulty breathing. VSS. Spouse at bedside.  ?

## 2021-09-17 NOTE — ED Triage Notes (Signed)
Patient with facial swelling that was noticed upon waking. Significant swelling noted to face and lips. No swelling noted to tongue and patient does not have complaints of itching at present. He does take lisinopril.  ?

## 2021-09-17 NOTE — ED Notes (Signed)
A&O x 4. Decrease in swelling to lips noted, although still swollen. No complaints of difficulty swallowing or shortness of breath. Denies pain. Gait steady. VSS. ?

## 2021-09-17 NOTE — Discharge Instructions (Addendum)
Please be aware that the reason you are having swelling of your lips is because of a medication that you take called lisinopril, you are to stop taking that immediately and never take it again.  When asked you should tell your doctors and any other healthcare professionals that you do have an allergy to ACE inhibitor medications ? ?Please go directly to your family doctor's office, they have requested that you go in for a visit based on what you have told me and they can help rearrange her blood pressure medications. ? ?You are to return to the emergency department immediately for any severe worsening swelling especially swelling of your tongue or difficulty breathing speaking or swallowing ?

## 2023-06-07 DIAGNOSIS — F17200 Nicotine dependence, unspecified, uncomplicated: Secondary | ICD-10-CM | POA: Diagnosis not present

## 2023-06-07 DIAGNOSIS — N400 Enlarged prostate without lower urinary tract symptoms: Secondary | ICD-10-CM | POA: Diagnosis not present

## 2023-06-07 DIAGNOSIS — I10 Essential (primary) hypertension: Secondary | ICD-10-CM | POA: Diagnosis not present

## 2023-06-07 DIAGNOSIS — F17210 Nicotine dependence, cigarettes, uncomplicated: Secondary | ICD-10-CM | POA: Diagnosis not present

## 2023-06-07 DIAGNOSIS — Z0001 Encounter for general adult medical examination with abnormal findings: Secondary | ICD-10-CM | POA: Diagnosis not present

## 2023-06-07 DIAGNOSIS — E1165 Type 2 diabetes mellitus with hyperglycemia: Secondary | ICD-10-CM | POA: Diagnosis not present

## 2023-06-07 DIAGNOSIS — Z1331 Encounter for screening for depression: Secondary | ICD-10-CM | POA: Diagnosis not present

## 2023-06-07 DIAGNOSIS — Z1389 Encounter for screening for other disorder: Secondary | ICD-10-CM | POA: Diagnosis not present

## 2023-06-07 DIAGNOSIS — F1020 Alcohol dependence, uncomplicated: Secondary | ICD-10-CM | POA: Diagnosis not present

## 2023-06-14 DIAGNOSIS — E1165 Type 2 diabetes mellitus with hyperglycemia: Secondary | ICD-10-CM | POA: Diagnosis not present

## 2023-06-14 DIAGNOSIS — I10 Essential (primary) hypertension: Secondary | ICD-10-CM | POA: Diagnosis not present

## 2023-06-21 ENCOUNTER — Emergency Department (HOSPITAL_COMMUNITY)
Admission: EM | Admit: 2023-06-21 | Discharge: 2023-06-21 | Disposition: A | Discharge status: Home / Self Care | Payer: Medicare PPO | Attending: Emergency Medicine | Admitting: Emergency Medicine

## 2023-06-21 ENCOUNTER — Emergency Department (HOSPITAL_COMMUNITY): Payer: Medicare PPO

## 2023-06-21 ENCOUNTER — Other Ambulatory Visit: Payer: Self-pay

## 2023-06-21 DIAGNOSIS — Z7982 Long term (current) use of aspirin: Secondary | ICD-10-CM | POA: Diagnosis not present

## 2023-06-21 DIAGNOSIS — I10 Essential (primary) hypertension: Secondary | ICD-10-CM | POA: Diagnosis not present

## 2023-06-21 DIAGNOSIS — I499 Cardiac arrhythmia, unspecified: Secondary | ICD-10-CM | POA: Diagnosis not present

## 2023-06-21 DIAGNOSIS — R42 Dizziness and giddiness: Secondary | ICD-10-CM | POA: Insufficient documentation

## 2023-06-21 DIAGNOSIS — Z7984 Long term (current) use of oral hypoglycemic drugs: Secondary | ICD-10-CM | POA: Diagnosis not present

## 2023-06-21 DIAGNOSIS — I213 ST elevation (STEMI) myocardial infarction of unspecified site: Secondary | ICD-10-CM | POA: Diagnosis not present

## 2023-06-21 DIAGNOSIS — E119 Type 2 diabetes mellitus without complications: Secondary | ICD-10-CM | POA: Insufficient documentation

## 2023-06-21 DIAGNOSIS — I672 Cerebral atherosclerosis: Secondary | ICD-10-CM | POA: Diagnosis not present

## 2023-06-21 DIAGNOSIS — I6523 Occlusion and stenosis of bilateral carotid arteries: Secondary | ICD-10-CM | POA: Diagnosis not present

## 2023-06-21 DIAGNOSIS — I708 Atherosclerosis of other arteries: Secondary | ICD-10-CM | POA: Diagnosis not present

## 2023-06-21 DIAGNOSIS — Z79899 Other long term (current) drug therapy: Secondary | ICD-10-CM | POA: Insufficient documentation

## 2023-06-21 DIAGNOSIS — E042 Nontoxic multinodular goiter: Secondary | ICD-10-CM | POA: Diagnosis not present

## 2023-06-21 DIAGNOSIS — R55 Syncope and collapse: Secondary | ICD-10-CM | POA: Diagnosis not present

## 2023-06-21 LAB — URINALYSIS, ROUTINE W REFLEX MICROSCOPIC
Bacteria, UA: NONE SEEN
Bilirubin Urine: NEGATIVE
Glucose, UA: NEGATIVE mg/dL
Hgb urine dipstick: NEGATIVE
Ketones, ur: NEGATIVE mg/dL
Nitrite: NEGATIVE
Protein, ur: 100 mg/dL — AB
Specific Gravity, Urine: 1.023 (ref 1.005–1.030)
pH: 5 (ref 5.0–8.0)

## 2023-06-21 LAB — COMPREHENSIVE METABOLIC PANEL
ALT: 27 U/L (ref 0–44)
AST: 29 U/L (ref 15–41)
Albumin: 3.9 g/dL (ref 3.5–5.0)
Alkaline Phosphatase: 77 U/L (ref 38–126)
Anion gap: 10 (ref 5–15)
BUN: 20 mg/dL (ref 8–23)
CO2: 25 mmol/L (ref 22–32)
Calcium: 9.4 mg/dL (ref 8.9–10.3)
Chloride: 101 mmol/L (ref 98–111)
Creatinine, Ser: 1.49 mg/dL — ABNORMAL HIGH (ref 0.61–1.24)
GFR, Estimated: 49 mL/min — ABNORMAL LOW (ref >60)
Glucose, Bld: 136 mg/dL — ABNORMAL HIGH (ref 70–99)
Potassium: 4.2 mmol/L (ref 3.5–5.1)
Sodium: 136 mmol/L (ref 135–145)
Total Bilirubin: 0.5 mg/dL (ref 0.0–1.2)
Total Protein: 7.6 g/dL (ref 6.5–8.1)

## 2023-06-21 LAB — CBC WITH DIFFERENTIAL/PLATELET
Abs Immature Granulocytes: 0.02 10*3/uL (ref 0.00–0.07)
Basophils Absolute: 0.1 10*3/uL (ref 0.0–0.1)
Basophils Relative: 1 %
Eosinophils Absolute: 0.1 10*3/uL (ref 0.0–0.5)
Eosinophils Relative: 2 %
HCT: 38.2 % — ABNORMAL LOW (ref 39.0–52.0)
Hemoglobin: 12.4 g/dL — ABNORMAL LOW (ref 13.0–17.0)
Immature Granulocytes: 0 %
Lymphocytes Relative: 18 %
Lymphs Abs: 1.1 10*3/uL (ref 0.7–4.0)
MCH: 29.4 pg (ref 26.0–34.0)
MCHC: 32.5 g/dL (ref 30.0–36.0)
MCV: 90.5 fL (ref 80.0–100.0)
Monocytes Absolute: 0.6 10*3/uL (ref 0.1–1.0)
Monocytes Relative: 10 %
Neutro Abs: 4.1 10*3/uL (ref 1.7–7.7)
Neutrophils Relative %: 69 %
Platelets: 229 10*3/uL (ref 150–400)
RBC: 4.22 MIL/uL (ref 4.22–5.81)
RDW: 14.1 % (ref 11.5–15.5)
WBC: 5.9 10*3/uL (ref 4.0–10.5)
nRBC: 0 % (ref 0.0–0.2)

## 2023-06-21 LAB — TROPONIN I (HIGH SENSITIVITY)
Troponin I (High Sensitivity): 3 ng/L (ref <18)
Troponin I (High Sensitivity): 3 ng/L (ref <18)

## 2023-06-21 MED ORDER — IOHEXOL 350 MG/ML SOLN
75.0000 mL | Freq: Once | INTRAVENOUS | Status: AC | PRN
  Administered 2023-06-21: 75 mL via INTRAVENOUS

## 2023-06-21 NOTE — ED Triage Notes (Signed)
Coming from work, pt start to feel lightheaded, work Engineer, civil (consulting) called ambulance for concerns of stroke. EMS was called to site, on assessment for EMS pt was negative for stroke. However EKG showed concern for stemi, cardiology verify EKG as "early repolarization"  On arrival pt is alert and oriented x4, denies dizziness or pain at this time.  Hx of HTN and hyperlipidemia.

## 2023-06-21 NOTE — ED Notes (Signed)
Got patient on the monitor did EKG shown to er provider patient is resting with call bell in reach  

## 2023-06-21 NOTE — ED Provider Notes (Signed)
[place] EMERGENCY DEPARTMENT AT [facility] Provider Note   CSN: [id number] Arrival date & time: 06/21/23  0847     History  Chief Complaint  Patient presents with   Dizziness    Todd [name] is a 78 y.o. male.  HPI 78 year old male history of hypertension, gout, diabetes presenting for dizziness.  Patient was sitting at work when he started to feel dizzy.  Describes as somewhat lightheaded, worse with standing but not frankly like he was going to pass out.  No spinning sensation or worsening with head movement.  He never any other symptoms include no headache or vision changes or weakness or numbness.  No chest pain or shortness of breath or nausea or vomiting or belly pain.  He did feel he can need to go the bathroom at the time but has not had any melena or hematochezia or diarrhea.  He is otherwise at his baseline health.  Symptoms lasted for few minutes and completely resolved.  He is asymptomatic.     Home Medications Prior to Admission medications   Medication Sig Start Date End Date Taking? Authorizing Provider  amLODipine (NORVASC) 10 MG tablet Take 10 mg by mouth daily.   Yes [provider]  aspirin EC 81 MG tablet Take 1 tablet by mouth daily. 11/27/10  Yes [provider]  atorvastatin (LIPITOR) 40 MG tablet Take 40 mg by mouth daily.   Yes [provider]  carboxymethylcellulose (REFRESH PLUS) 0.5 % SOLN Place 1 drop into both eyes 4 (four) times daily as needed (dry eyes).   Yes [provider]  Carboxymethylcellulose Sod PF 1 % GEL Place 1 drop into both eyes at bedtime as needed (dry eyes).   Yes [provider]  clobetasol ointment (TEMOVATE) 0.05 % Apply 1 Application topically 2 (two) times daily as needed (rash). 03/11/23  Yes [provider]  clotrimazole (LOTRIMIN) 1 % cream Apply 1 Application topically 2 (two) times daily as needed (rash). 05/06/23  Yes [provider]  Dextromethorphan  HBr (VICKS DAYQUIL COUGH) 15 MG/15ML LIQD Take 30 mLs by mouth every 6 (six) hours as needed (cough). Alcohol-free   Yes [provider]  finasteride (PROSCAR) 5 MG tablet Take 1 tablet by mouth daily. 05/06/23  Yes [provider]  glipiZIDE (GLUCOTROL) 5 MG tablet Take 2.5 mg by mouth daily before breakfast.   Yes [provider]  hydrALAZINE (APRESOLINE) 100 MG tablet Take 100 mg by mouth 3 (three) times daily.   Yes [provider]  metFORMIN (GLUCOPHAGE) 500 MG tablet Take 500 mg by mouth 2 (two) times daily with a meal.   Yes [provider]  spironolactone (ALDACTONE) 25 MG tablet Take 25 mg by mouth 2 (two) times daily. 06/14/23  Yes [provider]  tamsulosin (FLOMAX) 0.4 MG CAPS capsule Take 0.4 mg by mouth daily.   Yes [provider]  timolol (BETIMOL) 0.25 % ophthalmic solution Place 1 drop into both eyes 2 (two) times daily.   Yes [provider]      Allergies    Lisinopril    Review of Systems   Review of Systems Review of systems completed and notable as per HPI.  ROS otherwise negative.   Physical Exam Updated Vital Signs BP (!) 163/87   Pulse 72   Temp (!) 97.4 F (36.3 C) (Oral)   Resp 17   SpO2 98%  Physical Exam Vitals and nursing note reviewed.  Constitutional:  General: He is not in acute distress.    Appearance: He is well-developed.  HENT:     Head: Normocephalic and atraumatic.     Mouth/Throat:     Mouth: Mucous membranes are moist.     Pharynx: Oropharynx is clear.  Eyes:     Extraocular Movements: Extraocular movements intact.     Conjunctiva/sclera: Conjunctivae normal.     Pupils: Pupils are equal, round, and reactive to light.  Cardiovascular:     Rate and Rhythm: Normal rate and regular rhythm.     Pulses: Normal pulses.     Heart sounds: Normal heart sounds. No murmur heard. Pulmonary:     Effort: Pulmonary effort is normal. No respiratory distress.     Breath  sounds: Normal breath sounds.  Abdominal:     Palpations: Abdomen is soft.     Tenderness: There is no abdominal tenderness.  Musculoskeletal:        General: No swelling.     Cervical back: Neck supple.     Right lower leg: No edema.     Left lower leg: No edema.  Skin:    General: Skin is warm and dry.     Capillary Refill: Capillary refill takes less than 2 seconds.  Neurological:     General: No focal deficit present.     Mental Status: He is alert and oriented to person, place, and time. Mental status is at baseline.     Cranial Nerves: No cranial nerve deficit.     Sensory: No sensory deficit.     Motor: No weakness.     Coordination: Coordination normal.     Comments: Awake and alert.  Oriented to person, place, time, situation.  Normal speech.  Cranial nerves intact.  No visual field cuts.  No aphasia.  No neglect.  No facial asymmetry.  Full strength and normal sensation throughout upper and lower extremities without drift.  Normal finger-to-nose bilaterally.  Psychiatric:        Mood and Affect: Mood normal.     ED Results / Procedures / Treatments   Labs (all labs ordered are listed, but only abnormal results are displayed) Labs Reviewed  COMPREHENSIVE METABOLIC PANEL - Abnormal; Notable for the following components:      Result Value   Glucose, Bld 136 (*)    Creatinine, Ser 1.49 (*)    GFR, Estimated 49 (*)    All other components within normal limits  CBC WITH DIFFERENTIAL/PLATELET - Abnormal; Notable for the following components:   Hemoglobin 12.4 (*)    HCT 38.2 (*)    All other components within normal limits  URINALYSIS, ROUTINE W REFLEX MICROSCOPIC - Abnormal; Notable for the following components:   APPearance HAZY (*)    Protein, ur 100 (*)    Leukocytes,Ua TRACE (*)    All other components within normal limits  TROPONIN I (HIGH SENSITIVITY)  TROPONIN I (HIGH SENSITIVITY)    EKG EKG Interpretation Date/Time:  Monday June 21 2023 08:58:32  EST Ventricular Rate:  61 PR Interval:  176 QRS Duration:  94 QT Interval:  424 QTC Calculation: 428 R Axis:   51  Text Interpretation: Sinus rhythm No significant change since last tracing Confirmed by Fulton Reek [id number]) on 06/21/2023 9:01:08 AM  Radiology CT Angio Head Neck W WO CM Result Date: 06/21/2023 CLINICAL DATA:  Vertigo, central. EXAM: CT ANGIOGRAPHY HEAD AND NECK WITH AND WITHOUT CONTRAST TECHNIQUE: Multidetector CT imaging of the head and neck was performed using the  standard protocol during bolus administration of intravenous contrast. Multiplanar CT image reconstructions and MIPs were obtained to evaluate the vascular anatomy. Carotid stenosis measurements (when applicable) are obtained utilizing NASCET criteria, using the distal internal carotid diameter as the denominator. RADIATION DOSE REDUCTION: This exam was performed according to the departmental dose-optimization program which includes automated exposure control, adjustment of the mA and/or kV according to patient size and/or use of iterative reconstruction technique. CONTRAST:  75mL OMNIPAQUE IOHEXOL 350 MG/ML SOLN COMPARISON:  None Available. FINDINGS: CT HEAD FINDINGS Brain: There is no evidence of an acute infarct, intracranial hemorrhage, mass, midline shift, or extra-axial fluid collection. There is mild cerebral atrophy. Cerebral white matter hypodensities are nonspecific but compatible with mild chronic small vessel ischemic disease. Vascular: Calcified atherosclerosis at the skull base. No hyperdense vessel. Skull: No acute fracture or suspicious osseous lesion. Sinuses/Orbits: Paranasal sinuses and mastoid air cells are clear. Right cataract extraction. Other: None. Review of the MIP images confirms the above findings CTA NECK FINDINGS Aortic arch: Normal variant aortic arch branching pattern with common origin of the brachiocephalic and left common carotid arteries. Mild atherosclerosis in the arch. 50% stenosis of  the proximal left subclavian artery due to soft and calcified plaque. Right carotid system: Patent with prominent soft and calcified plaque at the carotid bifurcation and in the carotid bulb resulting in 70% stenosis of the common carotid artery immediately proximal to the bifurcation and 60% stenosis of the proximal ICA. Left carotid system: Patent with calcified and soft plaque scattered throughout the common carotid and proximal internal carotid arteries without a significant stenosis. Vertebral arteries: Patent with the right being strongly dominant. Mixed calcified and soft plaque in the proximal right V1 segment without a significant stenosis. Skeleton: Advanced multilevel facet arthrosis and predominantly mild disc degeneration in the cervical spine. Advanced left neural foraminal stenosis at C3-4 and C4-5. Other neck: No evidence of cervical lymphadenopathy. Subcentimeter thyroid nodules for which no follow-up imaging is recommended. 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 Seemingly separate 3.5 cm lipoma anterior to the right sternocleidomastoid muscle and lateral to the submandibular gland. Upper chest: 4 mm pulmonary nodule in the left upper lobe (series 11, image 169). Review of the MIP images confirms the above findings CTA HEAD FINDINGS Anterior circulation: The internal carotid arteries are patent from skull base to carotid termini with cavernous and paraclinoid atherosclerotic calcification resulting in no more than mild stenosis bilaterally. ACAs and MCAs are patent without evidence of a proximal branch occlusion or significant M1 stenosis. There is a mild proximal left A1 stenosis. Mild bilateral MCA branch vessel atherosclerotic irregularity is noted, and there is a mild-to-moderate left A3 stenosis. No aneurysm is identified. Posterior circulation: The intracranial vertebral arteries are patent to the basilar with the left being diminutive distal to the  PICA origin. Patent bilateral PICA, right AICA, and bilateral SCA origins are visualized. The basilar artery is widely patent. There are moderate-sized right and large left posterior communicating arteries with hypoplasia of the left P1 segment. Both PCAs are patent without evidence of a significant proximal stenosis on the left. There is a severe mid right P2 stenosis. No aneurysm is identified. Venous sinuses: As permitted by contrast timing, patent. Anatomic variants: Fetal left PCA.  Dominant right vertebral artery. Review of the MIP images confirms the above findings IMPRESSION: 1. No evidence of acute intracranial abnormality. 2. 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 3. 60% proximal right  ICA stenosis. 4. 50% proximal left subclavian artery stenosis. 5. 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 6. 4 mm left upper lobe pulmonary nodule. Per Fleischner Society Guidelines, if patient is low risk for malignancy, no routine follow-up imaging is recommended. If patient is high risk for malignancy, a non-contrast Chest CT at 12 months is optional. If performed and the nodule is stable at 12 months, no further follow-up is recommended. These guidelines do not apply to immunocompromised patients and patients with cancer. Follow up in patients with significant comorbidities as clinically warranted. For lung cancer screening, adhere to Lung-RADS guidelines. Reference: Radiology. 2017; 284(1):228-43. 7.  Aortic Atherosclerosis (ICD10-I70.0). Electronically Signed   By: Sebastian Ache M.D.   On: 06/21/2023 13:56    Procedures Procedures    Medications Ordered in ED Medications  iohexol (OMNIPAQUE) 350 MG/ML injection 75 mL (75 mLs Intravenous Contrast Given 06/21/23 1227)    ED Course/ Medical Decision Making/ A&P                                  Medical Decision Making Amount and/or Complexity of Data Reviewed Labs: ordered. Radiology:  ordered.  Risk Prescription drug management.   Medical Decision Making:   Todd [name] is a 78 y.o. male who presented to the ED today with episode of dizziness.  Lasted several minutes.  No other associated symptoms.  Symptoms have resolved.  EKG looks like early repolarization similar to prior no chest pain have lower concern for ACS.  No signs of arrhythmia here.  No murmur or evidence of valvular pathology.  Consider possible presyncope, arrhythmia, vagal episode, vertigo.  He has no deficits on exam, no indication for code stroke.  Obtain CTA of the head neck to rule out vertebral artery dissection and evaluate for intracranial bleeding.   Patient placed on continuous vitals and telemetry monitoring while in ED which was reviewed periodically.  Reviewed and confirmed nursing documentation for past medical history, family history, social history.  Reassessment and Plan:   CTA obtained which has multiple areas of stenosis including severe stenosis of P2.  No occlusion.  I spoke with Dr. Iver Nestle with neurology.  She does not feel like his symptoms were consistent with there is of stenosis he has and does not feel like they are likely causing his symptoms.  Patient is already on aspirin which is appropriate.  I reviewed patient's workup with the patient.  His work appears reassuring.  He has only very mild anemia.  Troponin is negative x 2, not consistent with ACS.  His renal function is at baseline.  He has some leukocyturia but no bacteria or other signs of infection and clinically no signs of UTI.  He remains asymptomatic, he has not had any recurrence of symptoms the whole time he has been here.  I talked with him again.  His symptoms seem more consistent with likely presyncope, given worsening dizziness with standing and feeling like he may pass out.  His symptoms have resolved, he is ambulating here without difficulty.  I did discuss the areas of stenosis on the CTA, as well as the pulmonary  nodule.  I recommend he follow-up with neurology and his primary care doctor.  He voiced understanding and states he can arrange follow-up.  I did give him strict return precautions for any signs or symptoms of stroke or persistent dizziness or syncope.  He was comfortable with plan for  discharge home.   Patient's presentation is most consistent with acute complicated illness / injury requiring diagnostic workup.           Final Clinical Impression(s) / ED Diagnoses Final diagnoses:  Dizziness    Rx / DC Orders ED Discharge Orders     None         Laurence Spates, MD 06/21/23 1626

## 2023-06-21 NOTE — ED Notes (Signed)
RN provided pt urine cup and pt ambulated to restroom without dizziness.

## 2023-06-21 NOTE — Discharge Instructions (Addendum)
You were seen today for dizziness.  Your CT angiogram showed multiple areas of tightened vessels in your brain.  You should make an appointment to follow-up with neurology about this.  You should also follow-up with your primary care doctor.  You also for small lung nodule on your x-ray.  You should follow-up with your doctor about this, you may need a CT scan in the future to reevaluate this.  Your lab work is otherwise reassuring.  Recommend follow-up closely with your primary care doctor.  If you develop worsening dizziness, vision changes, weakness, numbness, chest pain, difficulty breathing or fainting you should return to the ED.

## 2023-06-22 DIAGNOSIS — I6521 Occlusion and stenosis of right carotid artery: Secondary | ICD-10-CM | POA: Diagnosis not present

## 2023-06-22 DIAGNOSIS — I10 Essential (primary) hypertension: Secondary | ICD-10-CM | POA: Diagnosis not present

## 2023-06-22 DIAGNOSIS — I700 Atherosclerosis of aorta: Secondary | ICD-10-CM | POA: Diagnosis not present

## 2023-06-22 DIAGNOSIS — E1165 Type 2 diabetes mellitus with hyperglycemia: Secondary | ICD-10-CM | POA: Diagnosis not present

## 2023-06-25 ENCOUNTER — Telehealth: Payer: Self-pay

## 2023-06-25 NOTE — Progress Notes (Signed)
Transition Care Management Follow-up Telephone Call Date of discharge and from where: [facility] 1/20 How have you been since you were released from the hospital? Patient has followed up with provider Any questions or concerns? No  Items Reviewed: Did the pt receive and understand the discharge instructions provided? Yes  Medications obtained and verified? Yes  Other? No  Any new allergies since your discharge? No  Dietary orders reviewed? No Do you have support at home? Yes     Follow up appointments reviewed:  PCP Hospital f/u appt confirmed? Yes  Scheduled to see  on  @ . Specialist Hospital f/u appt confirmed? Yes  Scheduled to see  on  @ . Are transportation arrangements needed? No  If their condition worsens, is the pt aware to call PCP or go to the Emergency Dept.? Yes Was the patient provided with contact information for the PCP's office or ED? Yes Was to pt encouraged to call back with questions or concerns? Yes

## 2023-07-06 DIAGNOSIS — I10 Essential (primary) hypertension: Secondary | ICD-10-CM | POA: Diagnosis not present

## 2023-08-03 ENCOUNTER — Other Ambulatory Visit: Payer: Self-pay

## 2023-08-03 DIAGNOSIS — I6523 Occlusion and stenosis of bilateral carotid arteries: Secondary | ICD-10-CM

## 2023-08-11 DIAGNOSIS — Z9841 Cataract extraction status, right eye: Secondary | ICD-10-CM | POA: Diagnosis not present

## 2023-08-11 DIAGNOSIS — H2512 Age-related nuclear cataract, left eye: Secondary | ICD-10-CM | POA: Diagnosis not present

## 2023-08-11 DIAGNOSIS — H2640 Unspecified secondary cataract: Secondary | ICD-10-CM | POA: Diagnosis not present

## 2023-08-13 DIAGNOSIS — H26493 Other secondary cataract, bilateral: Secondary | ICD-10-CM | POA: Diagnosis not present

## 2023-08-17 ENCOUNTER — Encounter: Payer: Self-pay | Admitting: Vascular Surgery

## 2023-08-17 ENCOUNTER — Ambulatory Visit (INDEPENDENT_AMBULATORY_CARE_PROVIDER_SITE_OTHER): Payer: Medicare PPO | Admitting: Vascular Surgery

## 2023-08-17 ENCOUNTER — Ambulatory Visit (INDEPENDENT_AMBULATORY_CARE_PROVIDER_SITE_OTHER): Payer: Medicare PPO

## 2023-08-17 VITALS — BP 219/96 | HR 55 | Ht 67.0 in | Wt 165.0 lb

## 2023-08-17 DIAGNOSIS — I6523 Occlusion and stenosis of bilateral carotid arteries: Secondary | ICD-10-CM | POA: Diagnosis not present

## 2023-08-17 NOTE — Progress Notes (Signed)
 [facility] [place]  ASSESSMENT / PLAN: Todd [name] is a 78 y.o. male with asymptomatic right 60-70% carotid artery stenosis.   Recommend:  Abstinence from all tobacco products. Blood glucose control with goal A1c < 7%. Blood pressure control with goal blood pressure < 140/90 mmHg. Lipid reduction therapy with goal LDL-C <100 mg/dL  Aspirin 81mg  PO QD.  Atorvastatin 40-80mg  PO QD (or other "high intensity" statin therapy).  Follow-up with me in 6 months with repeat carotid duplex  CHIEF COMPLAINT: Dizziness workup revealed carotid stenosis  HISTORY OF PRESENT ILLNESS: Todd [name] is a 78 y.o. male referred to clinic for evaluation of carotid artery stenosis.  This was performed during ER workup for dizziness.  A CT angiogram was performed which showed significant atherosclerotic plaque in the right carotid bifurcation with in the distal common carotid artery and the proximal internal carotid artery.  The patient has had no stroke or strokelike symptoms.  We reviewed his testing in detail.  I reviewed the rationale for withholding intervention until a stenosis of 80% and asymptomatic patient.  We reviewed the rationale for surveillance.  Past Medical History:  Diagnosis Date   Ankle fracture    DM (diabetes mellitus) (HCC)    Gout    Hypertension     Past Surgical History:  Procedure Laterality Date   CATARACT EXTRACTION     COLONOSCOPY  09/13/2007   normal rectum,splenic flexure polyp removed 1 cm, tubular adenoma   COLONOSCOPY N/A 03/08/2015   [place]k: Diverticulosis, hyperplastic polyp removed. Given history of tubular adenoma in the past, 5-year surveillance colonoscopy recommended.   COLONOSCOPY WITH PROPOFOL N/A 07/19/2020   Procedure: COLONOSCOPY WITH PROPOFOL;  Surgeon: Corbin Ade, MD;  [facility]: [facility];  Service: Endoscopy;  Laterality: N/A;  10:30am   POLYPECTOMY  07/19/2020   Procedure: POLYPECTOMY;  Surgeon: Corbin Ade, MD;   Location: [facility];  Service: Endoscopy;;    Family History  Problem Relation Age of Onset   Colon cancer Neg Hx     Social History   Socioeconomic History   Marital status: Married    Spouse name: Not on file   Number of children: Not on file   Years of education: Not on file   Highest education level: Not on file  Occupational History   Not on file  Tobacco Use   Smoking status: Former    Types: Cigarettes   Smokeless tobacco: Never   Tobacco comments:    Smokes "occasionally, not every day."  Substance and Sexual Activity   Alcohol use: Yes    Alcohol/week: 0.0 standard drinks of alcohol    Comment: daily. Couple of shots.    Drug use: No   Sexual activity: Not on file  Other Topics Concern   Not on file  Social History Narrative   Not on file   Social Drivers of Health   Financial Resource Strain: Not on file  Food Insecurity: Not on file  Transportation Needs: Not on file  Physical Activity: Not on file  Stress: Not on file  Social Connections: Not on file  Intimate Partner Violence: Not on file    Allergies  Allergen Reactions   Lisinopril Anaphylaxis    Current Outpatient Medications  Medication Sig Dispense Refill   amLODipine (NORVASC) 10 MG tablet Take 10 mg by mouth daily.     aspirin EC 81 MG tablet Take 1 tablet by mouth daily.     atorvastatin (LIPITOR) 40 MG  tablet Take 40 mg by mouth daily.     carboxymethylcellulose (REFRESH PLUS) 0.5 % SOLN Place 1 drop into both eyes 4 (four) times daily as needed (dry eyes).     Carboxymethylcellulose Sod PF 1 % GEL Place 1 drop into both eyes at bedtime as needed (dry eyes).     clobetasol ointment (TEMOVATE) 0.05 % Apply 1 Application topically 2 (two) times daily as needed (rash).     clotrimazole (LOTRIMIN) 1 % cream Apply 1 Application topically 2 (two) times daily as needed (rash).     Dextromethorphan HBr (VICKS DAYQUIL COUGH) 15 MG/15ML LIQD Take 30 mLs by mouth every 6 (six) hours as needed  (cough). Alcohol-free     finasteride (PROSCAR) 5 MG tablet Take 1 tablet by mouth daily.     glipiZIDE (GLUCOTROL) 5 MG tablet Take 2.5 mg by mouth daily before breakfast.     hydrALAZINE (APRESOLINE) 100 MG tablet Take 100 mg by mouth 3 (three) times daily.     metFORMIN (GLUCOPHAGE) 500 MG tablet Take 500 mg by mouth 2 (two) times daily with a meal.     spironolactone (ALDACTONE) 25 MG tablet Take 25 mg by mouth 2 (two) times daily.     tamsulosin (FLOMAX) 0.4 MG CAPS capsule Take 0.4 mg by mouth daily.     timolol (BETIMOL) 0.25 % ophthalmic solution Place 1 drop into both eyes 2 (two) times daily.     No current facility-administered medications for this visit.    PHYSICAL EXAM Vitals:   08/17/23 1540 08/17/23 1541  BP: (!) 216/94 (!) 219/96  Pulse: (!) 55   SpO2: 100%   Weight: 165 lb (74.8 kg)   Height: 5\' 7"  (1.702 m)    Elderly man in no distress Regular rate and rhythm Unlabored breathing 2+ radial pulses Normal gait and station  PERTINENT LABORATORY AND RADIOLOGIC DATA  Most recent CBC    Latest Ref Rng & Units 06/21/2023    9:33 AM  CBC  WBC 4.0 - 10.5 K/uL 5.9   Hemoglobin 13.0 - 17.0 g/dL 09.8   Hematocrit 11.9 - 52.0 % 38.2   Platelets 150 - 400 K/uL 229      Most recent CMP    Latest Ref Rng & Units 06/21/2023    9:33 AM 06/19/2020    1:51 PM  CMP  Glucose 70 - 99 mg/dL 147  829   BUN 8 - 23 mg/dL 20  19   Creatinine 5.62 - 1.24 mg/dL 1.30  8.65   Sodium 784 - 145 mmol/L 136  137   Potassium 3.5 - 5.1 mmol/L 4.2  3.9   Chloride 98 - 111 mmol/L 101  104   CO2 22 - 32 mmol/L 25  24   Calcium 8.9 - 10.3 mg/dL 9.4  9.2   Total Protein 6.5 - 8.1 g/dL 7.6    Total Bilirubin 0.0 - 1.2 mg/dL 0.5    Alkaline Phos 38 - 126 U/L 77    AST 15 - 41 U/L 29    ALT 0 - 44 U/L 27     CT angiogram of head and neck 1. No evidence of acute intracranial abnormality. 2. 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 3. 60% proximal right ICA stenosis. 4.  50% proximal left subclavian artery stenosis. 5. 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 6. 4 mm left upper lobe pulmonary nodule. Per Fleischner Society Guidelines, if patient is low risk for malignancy, no routine  follow-up imaging is recommended. If patient is high risk for malignancy, a non-contrast Chest CT at 12 months is optional. If performed and the nodule is stable at 12 months, no further follow-up is recommended. These guidelines do not apply to immunocompromised patients and patients with cancer. Follow up in patients with significant comorbidities as clinically warranted. For lung cancer screening, adhere to Lung-RADS guidelines. Reference: Radiology. 2017; 284(1):228-43. 7.  Aortic Atherosclerosis (ICD10-I70.0).  Carotid duplex 08/17/2023 Right Carotid: Velocities in the right ICA are consistent with a 40-59%                 stenosis.   Left Carotid: Velocities in the left ICA are consistent with a 1-39%  stenosis.   Vertebrals: Bilateral vertebral arteries demonstrate antegrade flow.  Subclavians: Normal flow hemodynamics were seen in bilateral subclavian               arteries.  Rande Brunt. Lenell Antu, MD [facility] Phone Number: [phone] 08/17/2023 4:17 PM   Total time spent on preparing this encounter including chart review, data review, collecting history, examining the patient, coordinating care for this new patient, 60 minutes.  Portions of this report may have been transcribed using voice recognition software.  Every effort has been made to ensure accuracy; however, inadvertent computerized transcription errors may still be present.

## 2023-08-20 ENCOUNTER — Other Ambulatory Visit: Payer: Self-pay | Admitting: *Deleted

## 2023-08-20 DIAGNOSIS — I6523 Occlusion and stenosis of bilateral carotid arteries: Secondary | ICD-10-CM

## 2023-09-14 DIAGNOSIS — H2512 Age-related nuclear cataract, left eye: Secondary | ICD-10-CM | POA: Diagnosis not present

## 2023-09-21 NOTE — H&P (Signed)
 Surgical History & Physical  Patient Name: Todd [name]  DOB: November 13, 1945  Surgery: Cataract extraction with intraocular lens implant phacoemulsification; Left Eye Surgeon: Ardeth Krabbe MD Surgery Date: 09/24/2023 Pre-Op Date: 08/11/2023  HPI: A 21 Yr. old male patient 1. The patient is a new patient present for Cataract Evaluation. The patient complains of difficulty when reading fine print, books, newspaper, instructions etc., which began 1 year ago. Both eyes are affected. The episode is constant. The patient describes foggy symptoms affecting their eyes/vision. This is negatively affecting the patient's quality of life and the patient is unable to function adequately in life with the current level of vision. HPI was performed by Ardeth Krabbe .  Medical History: Dry Eyes Cataracts  High Blood Pressure  Review of Systems Cardiovascular High Blood Pressure All recorded systems are negative except as noted above.  Social Never smoked   Medication Amlodipine, Losartan, Atenolol, Spironolactone  Sx/Procedures Phaco c IOL OD, Laser - Yag Capsulotomy  Drug Allergies  NKDA  History & Physical: Heent: cataract NECK: supple without bruits LUNGS: lungs clear to auscultation CV: regular rate and rhythm Abdomen: soft and non-tender  Impression & Plan: Assessment: 1.  PSEUDOPHAKIA-POST OP (PRESENCE OF INTRAOCULAR LENS); Right Eye (Z98.41) 2.  CATARACT NUCLEAR SCLEROSIS AGE RELATED; Left Eye (H25.12) 3.  POSTERIOR CAPSULAR OPACIFICATION ; Right Eye (H26.40)  Plan: 1.  Performed in 2013 per patient. Now with quite dense PCO/residual cortical material. Will return for yag cap.  2.  Cataracts are visually significant and account for the patient's complaints. Discussed all risks, benefits, procedures and recovery, including infection, loss of vision and eye, need for glasses after surgery or additional procedures. Patient understands changing glasses will not improve vision.  Patient indicated understanding of procedure. All questions answered. Patient desires to have surgery, recommend phacoemulsification with intraocular lens. Patient to have preliminary testing necessary ([employer]/IOL Master, Mac OCT, TOPO) Educational materials provided:Cataract.  Plan: - Proceed with cataract surgery OS at next available - Plan for best distance target with DIB00 - No DM, no fuchs, no prior eye surgery - good dilation - dextenza   3.  YAG Laser capsulotomy recommend for patient's visually significant posterior capsule opacification. Improvement can only be achieved with surgery. Discussed all risks, benefits, alternatives, and potential complications. Discussed the procedures and recovery. Patient desires to have surgery. Schedule YAG - OD

## 2023-09-22 ENCOUNTER — Encounter (HOSPITAL_COMMUNITY): Payer: Self-pay

## 2023-09-22 ENCOUNTER — Other Ambulatory Visit: Payer: Self-pay

## 2023-09-22 ENCOUNTER — Encounter (HOSPITAL_COMMUNITY)
Admission: RE | Admit: 2023-09-22 | Discharge: 2023-09-22 | Disposition: A | Discharge status: Home / Self Care | Source: Ambulatory Visit | Attending: Optometry | Admitting: Optometry

## 2023-09-24 ENCOUNTER — Encounter (HOSPITAL_COMMUNITY): Payer: Self-pay | Admitting: Optometry

## 2023-09-24 ENCOUNTER — Encounter (HOSPITAL_COMMUNITY)
Admission: RE | Disposition: A | Discharge status: Home / Self Care | Payer: Self-pay | Source: Home / Self Care | Attending: Optometry

## 2023-09-24 ENCOUNTER — Ambulatory Visit (HOSPITAL_COMMUNITY): Admitting: Certified Registered"

## 2023-09-24 ENCOUNTER — Ambulatory Visit (HOSPITAL_COMMUNITY)
Admission: RE | Admit: 2023-09-24 | Discharge: 2023-09-24 | Disposition: A | Discharge status: Home / Self Care | Attending: Optometry | Admitting: Optometry

## 2023-09-24 ENCOUNTER — Other Ambulatory Visit: Payer: Self-pay

## 2023-09-24 DIAGNOSIS — H2512 Age-related nuclear cataract, left eye: Secondary | ICD-10-CM

## 2023-09-24 DIAGNOSIS — I10 Essential (primary) hypertension: Secondary | ICD-10-CM | POA: Diagnosis not present

## 2023-09-24 DIAGNOSIS — Z87891 Personal history of nicotine dependence: Secondary | ICD-10-CM | POA: Insufficient documentation

## 2023-09-24 DIAGNOSIS — Z9841 Cataract extraction status, right eye: Secondary | ICD-10-CM | POA: Insufficient documentation

## 2023-09-24 DIAGNOSIS — H5712 Ocular pain, left eye: Secondary | ICD-10-CM | POA: Diagnosis not present

## 2023-09-24 DIAGNOSIS — H2640 Unspecified secondary cataract: Secondary | ICD-10-CM | POA: Diagnosis not present

## 2023-09-24 DIAGNOSIS — E1136 Type 2 diabetes mellitus with diabetic cataract: Secondary | ICD-10-CM | POA: Insufficient documentation

## 2023-09-24 LAB — GLUCOSE, CAPILLARY: Glucose-Capillary: 114 mg/dL — ABNORMAL HIGH (ref 70–99)

## 2023-09-24 SURGERY — PHACOEMULSIFICATION, CATARACT, WITH IOL INSERTION
Anesthesia: General | Site: Eye | Laterality: Left

## 2023-09-24 MED ORDER — DEXAMETHASONE 0.4 MG OP INST
VAGINAL_INSERT | OPHTHALMIC | Status: AC
  Filled 2023-09-24: qty 1

## 2023-09-24 MED ORDER — MOXIFLOXACIN HCL 5 MG/ML IO SOLN
INTRAOCULAR | Status: DC | PRN
  Administered 2023-09-24: .2 mL via OPHTHALMIC

## 2023-09-24 MED ORDER — SIGHTPATH DOSE#1 NA HYALUR & NA CHOND-NA HYALUR IO KIT
PACK | INTRAOCULAR | Status: DC | PRN
Start: 2023-09-24 — End: 2023-09-24
  Administered 2023-09-24: 1 via OPHTHALMIC

## 2023-09-24 MED ORDER — PHENYLEPHRINE-KETOROLAC 1-0.3 % IO SOLN
INTRAOCULAR | Status: DC | PRN
  Administered 2023-09-24: 500 mL via OPHTHALMIC

## 2023-09-24 MED ORDER — PHENYLEPHRINE HCL 2.5 % OP SOLN
1.0000 [drp] | OPHTHALMIC | Status: AC | PRN
  Administered 2023-09-24 (×3): 1 [drp] via OPHTHALMIC

## 2023-09-24 MED ORDER — BSS IO SOLN
INTRAOCULAR | Status: DC | PRN
Start: 2023-09-24 — End: 2023-09-24
  Administered 2023-09-24: 15 mL via INTRAOCULAR

## 2023-09-24 MED ORDER — TETRACAINE 0.5 % OP SOLN OPTIME - NO CHARGE
OPHTHALMIC | Status: DC | PRN
Start: 2023-09-24 — End: 2023-09-24
  Administered 2023-09-24: 1 [drp] via OPHTHALMIC

## 2023-09-24 MED ORDER — STERILE WATER FOR IRRIGATION IR SOLN
Status: DC | PRN
  Administered 2023-09-24: 250 mL

## 2023-09-24 MED ORDER — MOXIFLOXACIN HCL 5 MG/ML IO SOLN
INTRAOCULAR | Status: AC
Start: 2023-09-24 — End: ?
  Filled 2023-09-24: qty 1

## 2023-09-24 MED ORDER — LIDOCAINE HCL 3.5 % OP GEL
1.0000 | Freq: Once | OPHTHALMIC | Status: AC
  Administered 2023-09-24: 1 via OPHTHALMIC

## 2023-09-24 MED ORDER — TETRACAINE HCL 0.5 % OP SOLN
1.0000 [drp] | OPHTHALMIC | Status: AC | PRN
  Administered 2023-09-24 (×3): 1 [drp] via OPHTHALMIC

## 2023-09-24 MED ORDER — POVIDONE-IODINE 5 % OP SOLN
OPHTHALMIC | Status: DC | PRN
  Administered 2023-09-24: 1 via OPHTHALMIC

## 2023-09-24 MED ORDER — PHENYLEPHRINE-KETOROLAC 1-0.3 % IO SOLN
INTRAOCULAR | Status: AC
  Filled 2023-09-24: qty 4

## 2023-09-24 MED ORDER — TROPICAMIDE 1 % OP SOLN
1.0000 [drp] | OPHTHALMIC | Status: AC | PRN
  Administered 2023-09-24 (×3): 1 [drp] via OPHTHALMIC

## 2023-09-24 MED ORDER — DEXAMETHASONE 0.4 MG OP INST
VAGINAL_INSERT | OPHTHALMIC | Status: DC | PRN
  Administered 2023-09-24: .4 mg via OPHTHALMIC

## 2023-09-24 MED ORDER — LIDOCAINE HCL (PF) 1 % IJ SOLN
INTRAMUSCULAR | Status: DC | PRN
Start: 2023-09-24 — End: 2023-09-24
  Administered 2023-09-24: 2 mL

## 2023-09-24 SURGICAL SUPPLY — 14 items
CATARACT SUITE SIGHTPATH (MISCELLANEOUS) ×1 IMPLANT
CLOTH BEACON ORANGE TIMEOUT ST (SAFETY) ×2 IMPLANT
DRSG TEGADERM 4X4.75 (GAUZE/BANDAGES/DRESSINGS) ×2 IMPLANT
EYE SHIELD UNIVERSAL CLEAR (GAUZE/BANDAGES/DRESSINGS) IMPLANT
FEE CATARACT SUITE SIGHTPATH (MISCELLANEOUS) ×2 IMPLANT
GLOVE BIOGEL PI IND STRL 7.0 (GLOVE) ×4 IMPLANT
LENS IOL TECNIS EYHANCE 19.5 (Intraocular Lens) IMPLANT
NDL HYPO 18GX1.5 BLUNT FILL (NEEDLE) ×2 IMPLANT
NEEDLE HYPO 18GX1.5 BLUNT FILL (NEEDLE) ×1 IMPLANT
PAD ARMBOARD POSITIONER FOAM (MISCELLANEOUS) ×2 IMPLANT
POSITIONER HEAD 8X9X4 ADT (SOFTGOODS) ×2 IMPLANT
SYR TB 1ML LL NO SAFETY (SYRINGE) ×2 IMPLANT
TAPE SURG TRANSPORE 1 IN (GAUZE/BANDAGES/DRESSINGS) IMPLANT
WATER STERILE IRR 250ML POUR (IV SOLUTION) ×2 IMPLANT

## 2023-09-24 NOTE — Anesthesia Preprocedure Evaluation (Addendum)
 Anesthesia Evaluation  Patient identified by MRN, date of birth, ID band Patient awake    Reviewed: Allergy & Precautions, H&P , NPO status , Patient's Chart, lab work & pertinent test results, reviewed documented beta blocker date and time   Airway Mallampati: II  TM Distance: >3 FB Neck ROM: full    Dental no notable dental hx.    Pulmonary former smoker   Pulmonary exam normal breath sounds clear to auscultation       Cardiovascular Exercise Tolerance: Good hypertension,  Rhythm:regular Rate:Normal     Neuro/Psych negative neurological ROS  negative psych ROS   GI/Hepatic negative GI ROS, Neg liver ROS,,,  Endo/Other  diabetes    Renal/GU negative Renal ROS  negative genitourinary   Musculoskeletal   Abdominal   Peds  Hematology negative hematology ROS (+)   Anesthesia Other Findings   Reproductive/Obstetrics negative OB ROS                             Anesthesia Physical Anesthesia Plan  ASA: 2  Anesthesia Plan: MAC   Post-op Pain Management:    Induction:   PONV Risk Score and Plan:   Airway Management Planned:   Additional Equipment:   Intra-op Plan:   Post-operative Plan:   Informed Consent: I have reviewed the patients History and Physical, chart, labs and discussed the procedure including the risks, benefits and alternatives for the proposed anesthesia with the patient or authorized representative who has indicated his/her understanding and acceptance.     Dental Advisory Given  Plan Discussed with: CRNA  Anesthesia Plan Comments:        Anesthesia Quick Evaluation

## 2023-09-24 NOTE — Transfer of Care (Signed)
 Immediate Anesthesia Transfer of Care Note  Patient: Todd [name]  Procedure(s) Performed: PHACOEMULSIFICATION, CATARACT, WITH IOL INSERTION (Left: Eye) INSERTION, STENT, DRUG-ELUTING, LACRIMAL CANALICULUS (Left: Eye)  Patient Location: PACU  Anesthesia Type:MAC  Level of Consciousness: awake, alert , oriented, and patient cooperative  Airway & Oxygen Therapy: Patient Spontanous Breathing  Post-op Assessment: Report given to RN, Post -op Vital signs reviewed and stable, and Patient moving all extremities X 4  Post vital signs: Reviewed and stable  Last Vitals:  Vitals Value Taken Time  BP 200/89 09/24/23 1107  Temp 36.4 C 09/24/23 1105  Pulse 51 09/24/23 1105  Resp 17 09/24/23 1105  SpO2 100 % 09/24/23 1105    Last Pain:  Vitals:   09/24/23 1105  TempSrc: Oral  PainSc: 0-No pain         Complications: No notable events documented.

## 2023-09-24 NOTE — Interval H&P Note (Signed)
 History and Physical Interval Note:  09/24/2023 10:02 AM  The H and P was reviewed and updated. The patient was examined.  No changes were found after exam.  The surgical eye was marked.  Christella App

## 2023-09-24 NOTE — Anesthesia Postprocedure Evaluation (Signed)
 Anesthesia Post Note  Patient: Microbiologist  Procedure(s) Performed: PHACOEMULSIFICATION, CATARACT, WITH IOL INSERTION (Left: Eye) INSERTION, STENT, DRUG-ELUTING, LACRIMAL CANALICULUS (Left: Eye)  Patient location during evaluation: Phase II Anesthesia Type: General Level of consciousness: awake Pain management: pain level controlled Vital Signs Assessment: post-procedure vital signs reviewed and stable Respiratory status: spontaneous breathing and respiratory function stable Cardiovascular status: blood pressure returned to baseline and stable Postop Assessment: no headache and no apparent nausea or vomiting Anesthetic complications: no Comments: Late entry   No notable events documented.   Last Vitals:  Vitals:   09/24/23 1105 09/24/23 1107  BP:  (!) 200/89  Pulse: (!) 51   Resp: 17   Temp: (!) 36.4 C   SpO2: 100%     Last Pain:  Vitals:   09/24/23 1105  TempSrc: Oral  PainSc: 0-No pain                 Coretha Dew

## 2023-09-24 NOTE — Discharge Instructions (Signed)
 Please discharge patient when stable, will follow up today with Dr. Ilsa Iha at the [facility] office immediately following discharge.  Leave shield in place until visit.  All paperwork with discharge instructions will be given at the office.  [facility] Address:  [address]  [place], [place] [address]  Dr. Chaya Jan Phone: [phone]

## 2023-09-24 NOTE — Op Note (Signed)
 Date of procedure: 09/24/23  Pre-operative diagnosis: Visually significant age-related nuclear cataract, Left Eye (H25.12)  Post-operative diagnosis: Visually significant age-related nuclear cataract, Left Eye  Procedure: Removal of cataract via phacoemulsification and insertion of intra-ocular lens J&J DIB00 +19.5D into the capsular bag of the Left Eye  Attending surgeon: Gale Jude, MD  Anesthesia: MAC, Topical Akten  Complications: None  Estimated Blood Loss: <13mL (minimal)  Specimens: None  Implants:  Implant Name Type Inv. Item Serial No. Manufacturer Lot No. LRB No. Used Action  LENS IOL TECNIS EYHANCE 19.5 - [id number] Intraocular Lens LENS IOL TECNIS EYHANCE 19.5 [id number] [employer]  Left 1 Implanted    Indications:  Visually significant age-related cataract, Left Eye  Procedure:  The patient was seen and identified in the pre-operative area. The operative eye was identified and dilated.  The operative eye was marked.  Topical anesthesia was administered to the operative eye.     The patient was then to the operative suite and placed in the supine position.  A timeout was performed confirming the patient, procedure to be performed, and all other relevant information.   The patient's face was prepped and draped in the usual fashion for intra-ocular surgery.  A lid speculum was placed into the operative eye and the surgical microscope moved into place and focused.  An inferotemporal paracentesis was created using a 20 gauge paracentesis blade.  BSS mixed with Omidria , followed by 1% lidocaine  was injected into the anterior chamber.  Viscoelastic was injected into the anterior chamber.  A temporal clear-corneal main wound incision was created using a 2.73mm microkeratome.  A continuous curvilinear capsulorrhexis was initiated using an irrigating cystitome and completed using capsulorrhexis forceps.  Hydrodissection and hydrodeliniation were performed.  Viscoelastic was  injected into the anterior chamber.  A phacoemulsification handpiece and a chopper as a second instrument were used to remove the nucleus and epinucleus. The irrigation/aspiration handpiece was used to remove any remaining cortical material.   The capsular bag was reinflated with viscoelastic, checked, and found to be intact.  The intraocular lens was inserted into the capsular bag.  The irrigation/aspiration handpiece was used to remove any remaining viscoelastic.  The clear corneal wound and paracentesis wounds were then hydrated and checked with Weck-Cels to be watertight. Moxifloxacin  was instilled into the anterior chamber.  The lid-speculum and drape were removed. The lower punctum was dilated, and the dextenza  implant was inserted into it. The patient's face was cleaned with a wet and dry 4x4.  A clear shield was taped over the eye. The patient was taken to the post-operative care unit in good condition, having tolerated the procedure well.  Post-Op Instructions: The patient will follow up at [facility] for a same day post-operative evaluation and will receive all other orders and instructions.

## 2023-09-27 ENCOUNTER — Encounter (HOSPITAL_COMMUNITY): Payer: Self-pay | Admitting: Optometry

## 2024-02-14 NOTE — Progress Notes (Unsigned)
 [facility] [place]  ASSESSMENT / PLAN: Todd [name] is a 78 y.o. male with asymptomatic right 60-70% carotid artery stenosis.   Recommend:  Abstinence from all tobacco products. Blood glucose control with goal A1c < 7%. Blood pressure control with goal blood pressure < 140/90 mmHg. Lipid reduction therapy with goal LDL-C <100 mg/dL  Aspirin 81mg  PO QD.  Atorvastatin 40-80mg  PO QD (or other high intensity statin therapy).  Follow-up with me in 6 months with repeat carotid duplex  CHIEF COMPLAINT: Dizziness workup revealed carotid stenosis  HISTORY OF PRESENT ILLNESS: Todd [name] is a 78 y.o. male referred to clinic for evaluation of carotid artery stenosis.  This was performed during ER workup for dizziness.  A CT angiogram was performed which showed significant atherosclerotic plaque in the right carotid bifurcation with in the distal common carotid artery and the proximal internal carotid artery.  The patient has had no stroke or strokelike symptoms.  We reviewed his testing in detail.  I reviewed the rationale for withholding intervention until a stenosis of 80% and asymptomatic patient.  We reviewed the rationale for surveillance.  Past Medical History:  Diagnosis Date   Ankle fracture    DM (diabetes mellitus) (HCC)    Gout    Hypertension     Past Surgical History:  Procedure Laterality Date   CATARACT EXTRACTION     CATARACT EXTRACTION W/PHACO Left 09/24/2023   Procedure: PHACOEMULSIFICATION, CATARACT, WITH IOL INSERTION;  Surgeon: Juli Blunt, MD;  Location: [facility];  Service: Ophthalmology;  Laterality: Left;  CDE: 1.20   COLONOSCOPY  09/13/2007   normal rectum,splenic flexure polyp removed 1 cm, tubular adenoma   COLONOSCOPY N/A 03/08/2015   [place]k: Diverticulosis, hyperplastic polyp removed. Given history of tubular adenoma in the past, 5-year surveillance colonoscopy recommended.   COLONOSCOPY WITH PROPOFOL  N/A 07/19/2020   Procedure:  COLONOSCOPY WITH PROPOFOL ;  Surgeon: Shaaron Lamar HERO, MD;  Location: [facility];  Service: Endoscopy;  Laterality: N/A;  10:30am   INSERTION, STENT, DRUG-ELUTING, LACRIMAL CANALICULUS Left 09/24/2023   Procedure: INSERTION, STENT, DRUG-ELUTING, LACRIMAL CANALICULUS;  Surgeon: Juli Blunt, MD;  Location: [facility];  Service: Ophthalmology;  Laterality: Left;   POLYPECTOMY  07/19/2020   Procedure: POLYPECTOMY;  Surgeon: Shaaron Lamar HERO, MD;  Location: [facility];  Service: Endoscopy;;    Family History  Problem Relation Age of Onset   Colon cancer Neg Hx     Social History   Socioeconomic History   Marital status: Married    Spouse name: Not on file   Number of children: Not on file   Years of education: Not on file   Highest education level: Not on file  Occupational History   Not on file  Tobacco Use   Smoking status: Former    Types: Cigarettes   Smokeless tobacco: Never   Tobacco comments:    Smokes occasionally, not every day.  Substance and Sexual Activity   Alcohol  use: Yes    Alcohol /week: 0.0 standard drinks of alcohol     Comment: daily. Couple of shots.    Drug use: No   Sexual activity: Not on file  Other Topics Concern   Not on file  Social History Narrative   Not on file   Social Drivers of Health   Financial Resource Strain: Not on file  Food Insecurity: Not on file  Transportation Needs: Not on file  Physical Activity: Not on file  Stress: Not on file  Social Connections: Not on file  Intimate Partner Violence: Not on file    Allergies  Allergen Reactions   Lisinopril Anaphylaxis    Current Outpatient Medications  Medication Sig Dispense Refill   amLODipine (NORVASC) 10 MG tablet Take 10 mg by mouth daily.     aspirin EC 81 MG tablet Take 1 tablet by mouth daily.     atorvastatin (LIPITOR) 40 MG tablet Take 40 mg by mouth daily.     carboxymethylcellulose (REFRESH PLUS) 0.5 % SOLN Place 1 drop into both eyes 4 (four) times daily as  needed (dry eyes).     Carboxymethylcellulose Sod PF 1 % GEL Place 1 drop into both eyes at bedtime as needed (dry eyes).     clobetasol ointment (TEMOVATE) 0.05 % Apply 1 Application topically 2 (two) times daily as needed (rash).     clotrimazole (LOTRIMIN) 1 % cream Apply 1 Application topically 2 (two) times daily as needed (rash).     Dextromethorphan HBr (VICKS DAYQUIL COUGH) 15 MG/15ML LIQD Take 30 mLs by mouth every 6 (six) hours as needed (cough). Alcohol -free     finasteride (PROSCAR) 5 MG tablet Take 1 tablet by mouth daily.     glipiZIDE (GLUCOTROL) 5 MG tablet Take 2.5 mg by mouth daily before breakfast.     hydrALAZINE (APRESOLINE) 100 MG tablet Take 100 mg by mouth 3 (three) times daily.     metFORMIN (GLUCOPHAGE) 500 MG tablet Take 500 mg by mouth 2 (two) times daily with a meal.     spironolactone (ALDACTONE) 25 MG tablet Take 25 mg by mouth 2 (two) times daily.     tamsulosin (FLOMAX) 0.4 MG CAPS capsule Take 0.4 mg by mouth daily.     timolol (BETIMOL) 0.25 % ophthalmic solution Place 1 drop into both eyes 2 (two) times daily.     No current facility-administered medications for this visit.    PHYSICAL EXAM There were no vitals filed for this visit.  Elderly man in no distress Regular rate and rhythm Unlabored breathing 2+ radial pulses Normal gait and station  PERTINENT LABORATORY AND RADIOLOGIC DATA  Most recent CBC    Latest Ref Rng & Units 06/21/2023    9:33 AM  CBC  WBC 4.0 - 10.5 K/uL 5.9   Hemoglobin 13.0 - 17.0 g/dL 87.5   Hematocrit 60.9 - 52.0 % 38.2   Platelets 150 - 400 K/uL 229      Most recent CMP    Latest Ref Rng & Units 06/21/2023    9:33 AM 06/19/2020    1:51 PM  CMP  Glucose 70 - 99 mg/dL 863  806   BUN 8 - 23 mg/dL 20  19   Creatinine 9.38 - 1.24 mg/dL 8.50  8.61   Sodium 864 - 145 mmol/L 136  137   Potassium 3.5 - 5.1 mmol/L 4.2  3.9   Chloride 98 - 111 mmol/L 101  104   CO2 22 - 32 mmol/L 25  24   Calcium 8.9 - 10.3 mg/dL 9.4   9.2   Total Protein 6.5 - 8.1 g/dL 7.6    Total Bilirubin 0.0 - 1.2 mg/dL 0.5    Alkaline Phos 38 - 126 U/L 77    AST 15 - 41 U/L 29    ALT 0 - 44 U/L 27     CT angiogram of head and neck 1. No evidence of acute intracranial abnormality. 2. 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 3. 60% proximal right ICA stenosis. 4. 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 5. 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 6. 4 mm left upper lobe pulmonary nodule. Per Fleischner Society Guidelines, if patient is low risk for malignancy, no routine follow-up imaging is recommended. If patient is high risk for malignancy, a non-contrast Chest CT at 12 months is optional. If performed and the nodule is stable at 12 months, no further follow-up is recommended. These guidelines do not apply to immunocompromised patients and patients with cancer. Follow up in patients with significant comorbidities as clinically warranted. For lung cancer screening, adhere to Lung-RADS guidelines. Reference: Radiology. 2017; 284(1):228-43. 7.  Aortic Atherosclerosis (ICD10-I70.0).  Carotid duplex 08/17/2023 Right Carotid: Velocities in the right ICA are consistent with a 40-59%                 stenosis.   Left Carotid: Velocities in the left ICA are consistent with a 1-39%  stenosis.   Vertebrals: Bilateral vertebral arteries demonstrate antegrade flow.  Subclavians: Normal flow hemodynamics were seen in bilateral subclavian               arteries.  Debby SAILOR. Magda, MD FACS [facility] Phone Number: [phone] 02/14/2024 8:51 PM   Total time spent on preparing this encounter including chart review, data review, collecting history, examining the patient, coordinating care for this new patient, 60 minutes.  Portions of this report may have been transcribed using voice recognition software.  Every effort has been made to ensure  accuracy; however, inadvertent computerized transcription errors may still be present.

## 2024-02-15 ENCOUNTER — Ambulatory Visit (INDEPENDENT_AMBULATORY_CARE_PROVIDER_SITE_OTHER)

## 2024-02-15 ENCOUNTER — Encounter: Payer: Self-pay | Admitting: Vascular Surgery

## 2024-02-15 ENCOUNTER — Ambulatory Visit (INDEPENDENT_AMBULATORY_CARE_PROVIDER_SITE_OTHER): Admitting: Vascular Surgery

## 2024-02-15 VITALS — BP 172/80 | HR 53 | Ht 67.0 in | Wt 161.0 lb

## 2024-02-15 DIAGNOSIS — I6523 Occlusion and stenosis of bilateral carotid arteries: Secondary | ICD-10-CM | POA: Diagnosis not present

## 2024-02-22 DIAGNOSIS — E119 Type 2 diabetes mellitus without complications: Secondary | ICD-10-CM | POA: Diagnosis not present

## 2024-02-22 DIAGNOSIS — I10 Essential (primary) hypertension: Secondary | ICD-10-CM | POA: Diagnosis not present

## 2024-03-27 ENCOUNTER — Other Ambulatory Visit (HOSPITAL_COMMUNITY): Payer: Self-pay | Admitting: Nephrology

## 2024-03-27 ENCOUNTER — Ambulatory Visit (HOSPITAL_COMMUNITY)
Admission: RE | Admit: 2024-03-27 | Discharge: 2024-03-27 | Disposition: A | Discharge status: Home / Self Care | Source: Ambulatory Visit | Attending: Surgery | Admitting: Surgery

## 2024-03-27 DIAGNOSIS — I10 Essential (primary) hypertension: Secondary | ICD-10-CM | POA: Insufficient documentation

## 2024-04-11 DIAGNOSIS — E1122 Type 2 diabetes mellitus with diabetic chronic kidney disease: Secondary | ICD-10-CM | POA: Diagnosis not present

## 2024-04-11 DIAGNOSIS — N1831 Chronic kidney disease, stage 3a: Secondary | ICD-10-CM | POA: Diagnosis not present

## 2024-04-11 DIAGNOSIS — I10 Essential (primary) hypertension: Secondary | ICD-10-CM | POA: Diagnosis not present

## 2024-04-11 DIAGNOSIS — Z7984 Long term (current) use of oral hypoglycemic drugs: Secondary | ICD-10-CM | POA: Diagnosis not present

## 2024-07-02 ENCOUNTER — Other Ambulatory Visit: Payer: Self-pay

## 2024-07-02 ENCOUNTER — Emergency Department (HOSPITAL_COMMUNITY)
Admission: EM | Admit: 2024-07-02 | Discharge: 2024-07-02 | Disposition: A | Discharge status: Home / Self Care | Attending: Emergency Medicine | Admitting: Emergency Medicine

## 2024-07-02 ENCOUNTER — Encounter (HOSPITAL_COMMUNITY): Payer: Self-pay | Admitting: *Deleted

## 2024-07-02 ENCOUNTER — Emergency Department (HOSPITAL_COMMUNITY)

## 2024-07-02 DIAGNOSIS — S62114A Nondisplaced fracture of triquetrum [cuneiform] bone, right wrist, initial encounter for closed fracture: Secondary | ICD-10-CM

## 2024-07-02 NOTE — ED Provider Notes (Signed)
 " [place] EMERGENCY DEPARTMENT AT [facility] Provider Note   CSN: [id number] Arrival date & time: 07/02/24  1228     Patient presents with: Todd [name]   Todd [name] is a 79 y.o. male.  79 year old male presents emergency department with complaints of mechanical fall yesterday.  He reports he was moving his trash can and there was ice underneath this note, he proceeded to slip and catch his fall with an outstretched right hand.  He endorses landing on his right hip as well.  Patient denies blood thinners, head injury, loss of consciousness, dizziness prior or after fall.  Patient denies any other pain from the incident.  He reports he still has good sensation throughout the hand and has been able to use it since the fall.  He does endorse some swelling to the wrist and hand.     Prior to Admission medications  Medication Sig Start Date End Date Taking? Authorizing Provider  amLODipine (NORVASC) 10 MG tablet Take 10 mg by mouth daily.    [provider]  aspirin EC 81 MG tablet Take 1 tablet by mouth daily. 11/27/10   [provider]  atorvastatin (LIPITOR) 40 MG tablet Take 40 mg by mouth daily.    [provider]  carboxymethylcellulose (REFRESH PLUS) 0.5 % SOLN Place 1 drop into both eyes 4 (four) times daily as needed (dry eyes).    [provider]  Carboxymethylcellulose Sod PF 1 % GEL Place 1 drop into both eyes at bedtime as needed (dry eyes).    [provider]  clobetasol ointment (TEMOVATE) 0.05 % Apply 1 Application topically 2 (two) times daily as needed (rash). 03/11/23   [provider]  clotrimazole (LOTRIMIN) 1 % cream Apply 1 Application topically 2 (two) times daily as needed (rash). 05/06/23   [provider]  Dextromethorphan HBr (VICKS DAYQUIL COUGH) 15 MG/15ML LIQD Take 30 mLs by mouth every 6 (six) hours as needed (cough). Alcohol -free    [provider]  finasteride (PROSCAR) 5 MG tablet Take  1 tablet by mouth daily. 05/06/23   [provider]  glipiZIDE (GLUCOTROL) 5 MG tablet Take 2.5 mg by mouth daily before breakfast.    [provider]  hydrALAZINE (APRESOLINE) 100 MG tablet Take 100 mg by mouth 3 (three) times daily.    [provider]  metFORMIN (GLUCOPHAGE) 500 MG tablet Take 500 mg by mouth 2 (two) times daily with a meal.    [provider]  spironolactone (ALDACTONE) 25 MG tablet Take 25 mg by mouth 2 (two) times daily. 06/14/23   [provider]  tamsulosin (FLOMAX) 0.4 MG CAPS capsule Take 0.4 mg by mouth daily.    [provider]  timolol (BETIMOL) 0.25 % ophthalmic solution Place 1 drop into both eyes 2 (two) times daily.    [provider]    Allergies: Lisinopril    Review of Systems  Musculoskeletal:  Positive for arthralgias, joint swelling and myalgias.  All other systems reviewed and are negative.   Updated Vital Signs BP (!) 155/65 (BP Location: Left Arm)   Pulse (!) 59   Temp 99.1 F (37.3 C) (Oral)   Resp 17   Ht 5' 7 (1.702 m)   Wt 73.9 kg   SpO2 95%   BMI 25.53 kg/m   Physical Exam Vitals and nursing note reviewed.  Constitutional:      General: He is not in acute distress.    Appearance: Normal appearance. He  is not ill-appearing or toxic-appearing.  HENT:     Head: Normocephalic and atraumatic.     Nose: Nose normal.  Eyes:     Extraocular Movements: Extraocular movements intact.     Conjunctiva/sclera: Conjunctivae normal.     Pupils: Pupils are equal, round, and reactive to light.  Cardiovascular:     Rate and Rhythm: Normal rate.  Pulmonary:     Effort: Pulmonary effort is normal. No respiratory distress.     Breath sounds: Normal breath sounds.     Comments: Lungs are clear to auscultation and no pain with deep inspiration Abdominal:     General: Abdomen is flat.     Tenderness: There is no abdominal tenderness. There is no guarding.  Musculoskeletal:         General: Swelling present. Normal range of motion.     Cervical back: Normal range of motion.     Comments: Swelling noted around the right hand.  Pain to palpation around the wrist and hand no obvious deformities.  Patient has full range of motion of hand and wrist.  Neurological:     General: No focal deficit present.     Mental Status: He is alert.  Psychiatric:        Mood and Affect: Mood normal.        Behavior: Behavior normal.     (all labs ordered are listed, but only abnormal results are displayed) Labs Reviewed - No data to display  EKG: None  Radiology: DG Hand Complete Right Result Date: 07/02/2024 CLINICAL DATA:  s/p fall; fall EXAM: RIGHT HAND - COMPLETE 3+ VIEW; RIGHT WRIST - COMPLETE 3+ VIEW COMPARISON:  None Available. FINDINGS: There is a small osseous fleck on the dorsal aspect of the lunate on the lateral radiograph and just ulnar on [facility] radiograph. This is most consistent with a fracture, favored triquetral. Associated soft tissue edema. Vascular calcifications. Degenerative changes along the distal radioulnar joint. Mild joint space narrowing and osteophyte formation along the MCPs. Scattered degenerative changes of the DIPs. IMPRESSION: Constellation of findings are most consistent with a triquetral fracture. Electronically Signed   By: Corean Salter M.D.   On: 07/02/2024 13:59   DG Wrist Complete Right Result Date: 07/02/2024 CLINICAL DATA:  s/p fall; fall EXAM: RIGHT HAND - COMPLETE 3+ VIEW; RIGHT WRIST - COMPLETE 3+ VIEW COMPARISON:  None Available. FINDINGS: There is a small osseous fleck on the dorsal aspect of the lunate on the lateral radiograph and just ulnar on [facility] radiograph. This is most consistent with a fracture, favored triquetral. Associated soft tissue edema. Vascular calcifications. Degenerative changes along the distal radioulnar joint. Mild joint space narrowing and osteophyte formation along the MCPs. Scattered degenerative changes of the DIPs.  IMPRESSION: Constellation of findings are most consistent with a triquetral fracture. Electronically Signed   By: Corean Salter M.D.   On: 07/02/2024 13:59     Procedures   Medications Ordered in the ED - No data to display  79 y.o. male presents to the ED with complaints of right hand and wrist pain following mechanical fall,  The differential diagnosis includes but not limited to fracture, dislocation, tendon/ligamentous injury, shoulder dislocation, clavicle fracture (Ddx)  On arrival pt is nontoxic, vitals unremarkable. Exam significant for mild swelling noted around the right wrist and pain with palpation  Imaging Studies ordered:  I ordered imaging studies which included wrist and hand x-ray, concerning for triquetral fracture.  ED Course:   Patient is sitting comfortably in  ED bed in no acute distress nontoxic-appearing.  No other reported injuries and no pain to palpation throughout the rest of the extremity or hip/leg.  No pain to chest wall and no pain with deep inspiration low concern for rib fracture.  Will proceed with hand and wrist x-ray for further evaluation.  X-rays were concerning for triquetral fracture.  Patient is neurovascularly intact with good range of motion.  Spoke with Dr. Celena he advised volar or thumb spica and follow-up in office.  Patient was advised of treatment plan and strict return precautions and agreed.  Patient was comfortable with discharge at this time.   Portions of this note were generated with Scientist, clinical (histocompatibility and immunogenetics). Dictation errors may occur despite best attempts at proofreading.   Final diagnoses:  Closed nondisplaced fracture of triquetrum of right wrist, initial encounter    ED Discharge Orders     None          Myriam Fonda RAMAN, [place] 07/02/24 1503  "

## 2024-07-02 NOTE — Discharge Instructions (Addendum)
 X-rays were concerning for triquetral fracture.  We have placed you in a splint please keep your hand in the splint and follow-up with the orthopedic doctor in office for further treatment.  I provided the number with the discharge paperwork or you can follow-up with your orthopedic doctor at the [facility].  If you experience any concerning new or worsening symptoms such as loss of feeling of the hand, significant increase in pain, or discoloration of the skin please return to emergency department for further evaluation.

## 2024-07-02 NOTE — ED Triage Notes (Signed)
 Pt fell yesterday while taking his trash can back to the house.  Denies hitting his head. Right hand swelling and right wrist pain.
